# Patient Record
Sex: Male | Born: 1995 | Race: Black or African American | Hispanic: No | Marital: Single | Smoking: Current every day smoker
Health system: Southern US, Community
[De-identification: ages and names within clinical notes are randomized; demographics above are authoritative.]

## PROBLEM LIST (undated history)

## (undated) DIAGNOSIS — F419 Anxiety disorder, unspecified: Secondary | ICD-10-CM

## (undated) DIAGNOSIS — F32A Depression, unspecified: Secondary | ICD-10-CM

## (undated) DIAGNOSIS — F329 Major depressive disorder, single episode, unspecified: Secondary | ICD-10-CM

---
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
## 2018-06-09 LAB — HM HIV SCREENING LAB: HM HIV SCREENING: NEGATIVE

## 2018-11-05 ENCOUNTER — Other Ambulatory Visit: Payer: Self-pay

## 2018-11-05 ENCOUNTER — Emergency Department (HOSPITAL_COMMUNITY)
Admission: EM | Admit: 2018-11-05 | Discharge: 2018-11-05 | Disposition: A | Discharge status: Home / Self Care | Payer: Medicaid Other | Attending: Emergency Medicine | Admitting: Emergency Medicine

## 2018-11-05 ENCOUNTER — Encounter (HOSPITAL_COMMUNITY): Payer: Self-pay | Admitting: Emergency Medicine

## 2018-11-05 DIAGNOSIS — R300 Dysuria: Secondary | ICD-10-CM | POA: Insufficient documentation

## 2018-11-05 DIAGNOSIS — R21 Rash and other nonspecific skin eruption: Secondary | ICD-10-CM | POA: Diagnosis present

## 2018-11-05 DIAGNOSIS — Z202 Contact with and (suspected) exposure to infections with a predominantly sexual mode of transmission: Secondary | ICD-10-CM | POA: Diagnosis not present

## 2018-11-05 DIAGNOSIS — B356 Tinea cruris: Secondary | ICD-10-CM | POA: Insufficient documentation

## 2018-11-05 LAB — URINALYSIS, ROUTINE W REFLEX MICROSCOPIC
BILIRUBIN URINE: NEGATIVE
Glucose, UA: NEGATIVE mg/dL
Hgb urine dipstick: NEGATIVE
Ketones, ur: NEGATIVE mg/dL
Nitrite: NEGATIVE
Protein, ur: NEGATIVE mg/dL
SPECIFIC GRAVITY, URINE: 1.025 (ref 1.005–1.030)
pH: 7 (ref 5.0–8.0)

## 2018-11-05 MED ORDER — TERBINAFINE HCL 1 % EX CREA: 1.0000 "application " | TOPICAL_CREAM | Freq: Two times a day (BID) | CUTANEOUS | 0 refills | Status: DC

## 2018-11-05 MED ORDER — ONDANSETRON 4 MG PO TBDP
8.0000 mg | ORAL_TABLET | Freq: Once | ORAL | Status: AC
  Administered 2018-11-05: 8 mg via ORAL
  Filled 2018-11-05: qty 2

## 2018-11-05 MED ORDER — AZITHROMYCIN 250 MG PO TABS
1000.0000 mg | ORAL_TABLET | Freq: Once | ORAL | Status: AC
  Administered 2018-11-05: 1000 mg via ORAL
  Filled 2018-11-05: qty 4

## 2018-11-05 MED ORDER — CEFTRIAXONE SODIUM 250 MG IJ SOLR
250.0000 mg | Freq: Once | INTRAMUSCULAR | Status: AC
  Administered 2018-11-05: 250 mg via INTRAMUSCULAR
  Filled 2018-11-05: qty 250

## 2018-11-05 MED ORDER — LIDOCAINE HCL (PF) 1 % IJ SOLN
INTRAMUSCULAR | Status: AC
  Administered 2018-11-05: 0.9 mL
  Filled 2018-11-05: qty 5

## 2018-11-05 MED ORDER — METRONIDAZOLE 500 MG PO TABS
2000.0000 mg | ORAL_TABLET | Freq: Once | ORAL | Status: AC
  Administered 2018-11-05: 2000 mg via ORAL
  Filled 2018-11-05: qty 4

## 2018-11-05 NOTE — Discharge Instructions (Signed)
You have been treated for gonorrhea, chlamydia, and trichomonas.  Please take the terbinafine cream applied to the scrotum twice a day for 7 days.  This is for jock itch.  Use a condom with every sexual encounter Please follow-up with the health department as needed for future STI needs. Please return to the ER for worsening symptoms, high fevers or persistent vomiting.  You have been tested for HIV, syphilis, chlamydia and gonorrhea. These results will be available in approximately 3 days. You will be notified if they are positive.   It is very important to practice safe sex and use condoms when sexually active. If your results are positive you need to notify all sexual partners so they can be treated as well. The website https://garcia.net/ can be used to send anonymous text messages or emails to alert sexual contacts.   SEEK IMMEDIATE MEDICAL CARE IF:  You develop an oral temperature above 102 F (38.9 C), not controlled by medications or lasting more than 2 days.  You develop an increase in pain.  Please return the emergency department you develop any testicular pain, pain with bowel movements, vomiting, nausea, or vomiting.

## 2018-11-05 NOTE — ED Provider Notes (Signed)
[name] Provider Note   CSN: [id number] Arrival date & time: 11/05/18  1229    History   Chief Complaint Chief Complaint  Patient presents with  . SEXUALLY TRANSMITTED DISEASE    HPI [name] is a 23 y.o. male.     HPI  Patient is a 23 year old male with no significant past medical history presenting for STI exposure.  Patient reports he began having dysuria today.  Patient reports that a partner that he has had sexual intercourse with within the past month tested positive for chlamydia.  He is not aware of any other STIs, however he has had other partners.  Patient reports that he has noted a rash on his scrotum that is slightly painful.  It preceded his symptoms of dysuria.  Patient denies any abdominal pain, nausea, vomiting, fevers, rectal pain or pain with bowel movements.  History reviewed. No pertinent past medical history.  There are no active problems to display for this patient.   History reviewed. No pertinent surgical history.      Home Medications    Prior to Admission medications   Not on File    Family History History reviewed. No pertinent family history.  Social History Social History   Tobacco Use  . Smoking status: Not on file  Substance Use Topics  . Alcohol use: Not on file  . Drug use: Yes    Types: Marijuana     Allergies   Patient has no known allergies.   Review of Systems Review of Systems  Constitutional: Negative for chills and fever.  Gastrointestinal: Negative for abdominal pain, nausea and vomiting.  Genitourinary: Positive for dysuria. Negative for flank pain, frequency, penile pain, penile swelling, testicular pain and urgency.     Physical Exam Updated Vital Signs BP 132/76 (BP Location: Right Arm)   Pulse 69   Temp 97.8 F (36.6 C) (Oral)   Resp 18   SpO2 97%   Physical Exam Vitals signs and nursing note reviewed.  Constitutional:      General: He is  not in acute distress.    Appearance: He is well-developed. He is not diaphoretic.     Comments: Sitting comfortably in bed.  HENT:     Head: Normocephalic and atraumatic.  Eyes:     General:        Right eye: No discharge.        Left eye: No discharge.     Conjunctiva/sclera: Conjunctivae normal.     Comments: EOMs normal to gross examination.  Neck:     Musculoskeletal: Normal range of motion.  Cardiovascular:     Rate and Rhythm: Normal rate and regular rhythm.     Comments: Intact, 2+ radial pulse. Abdominal:     General: There is no distension.     Tenderness: There is no abdominal tenderness.  Genitourinary:    Comments: Physical examination performed with RN chaperone present.  Patient has no discharge from penis.  No visible lesions of the scrotum.  Patient is reporting some irritation at junction of perineum.  Musculoskeletal: Normal range of motion.  Skin:    General: Skin is warm and dry.  Neurological:     Mental Status: He is alert.     Comments: Cranial nerves intact to gross observation. Patient moves extremities without difficulty.  Psychiatric:        Behavior: Behavior normal.        Thought Content: Thought content normal.  Judgment: Judgment normal.      ED Treatments / Results  Labs (all labs ordered are listed, but only abnormal results are displayed) Labs Reviewed  URINALYSIS, ROUTINE W REFLEX MICROSCOPIC - Abnormal; Notable for the following components:      Result Value   Leukocytes,Ua SMALL (*)    Bacteria, UA RARE (*)    All other components within normal limits  RPR  HIV ANTIBODY (ROUTINE TESTING W REFLEX)  GC/CHLAMYDIA PROBE AMP ([place]) NOT AT [facility]    EKG None  Radiology No results found.  Procedures Procedures (including critical care time)  Medications Ordered in ED Medications  cefTRIAXone (ROCEPHIN) injection 250 mg (has no administration in time range)  azithromycin (ZITHROMAX) tablet 1,000 mg (has no  administration in time range)  metroNIDAZOLE (FLAGYL) tablet 2,000 mg (has no administration in time range)  ondansetron (ZOFRAN-ODT) disintegrating tablet 8 mg (has no administration in time range)     Initial Impression / Assessment and Plan / ED Course  I have reviewed the triage vital signs and the nursing notes.  Pertinent labs & imaging results that were available during my care of the patient were reviewed by me and considered in my medical decision making (see chart for details).  Clinical Course as of Nov 05 1349  Fri Nov 05, 2018  1337 Leukocytes likely 2/2 to STI.   Urinalysis, Routine w reflex microscopic(!) [AM]    Clinical Course User Index [AM] Phillip Ponder, PA-C       Patient is nontoxic appearing, afebrile, and in no acute distress.  Patient with known exposure to chlamydia.  Patient has had multiple sexual encounters, unprotected in the last 3 months.  Patient was treated for gonorrhea, chlamydia, trichomonas.  Will treat empirically for tinea cruris.  He is instructed that he will receive any results in 48 to 72 hours, would not receive a call for any negative results.  He was instructed to contact all partners to notify for testing and treatment.  Return precautions given for any increasing pain, scrotal pain or swelling, rectal pain, abdominal pain, nausea or vomiting.  Patient is understanding and agrees with the plan of care.  Final Clinical Impressions(s) / ED Diagnoses   Final diagnoses:  STD exposure  Tinea cruris    ED Discharge Orders         Ordered    terbinafine (LAMISIL) 1 % cream  2 times daily     11/05/18 1349           [name], [place] 11/05/18 1352    Tegeler, Phillip Brim, MD 11/05/18 [phone]

## 2018-11-05 NOTE — ED Notes (Signed)
Pt stable, ambulatory, and verbalizes understanding of d/c instructions. Armband removed, pt discharged from ED. 

## 2018-11-05 NOTE — ED Triage Notes (Signed)
Pt presents for eval of STDs, pt states his girlfriend was diagnosed with chlamydia, states he has the same. States it feels like "diamonds are coming out" when voiding. Pt states he has been unable to get in with the clinic for STD testing.

## 2018-11-06 LAB — RPR: RPR Ser Ql: NONREACTIVE

## 2018-11-06 LAB — HIV ANTIBODY (ROUTINE TESTING W REFLEX): HIV Screen 4th Generation wRfx: NONREACTIVE

## 2018-11-08 LAB — GC/CHLAMYDIA PROBE AMP (~~LOC~~) NOT AT ARMC
Chlamydia: POSITIVE — AB
Neisseria Gonorrhea: NEGATIVE

## 2019-09-01 ENCOUNTER — Other Ambulatory Visit: Payer: Self-pay

## 2019-09-01 ENCOUNTER — Encounter (HOSPITAL_COMMUNITY): Payer: Self-pay

## 2019-09-01 ENCOUNTER — Emergency Department (HOSPITAL_COMMUNITY)
Admission: EM | Admit: 2019-09-01 | Discharge: 2019-09-02 | Disposition: A | Discharge status: Home / Self Care | Payer: Medicaid Other | Attending: Emergency Medicine | Admitting: Emergency Medicine

## 2019-09-01 DIAGNOSIS — Y999 Unspecified external cause status: Secondary | ICD-10-CM | POA: Diagnosis not present

## 2019-09-01 DIAGNOSIS — S50819A Abrasion of unspecified forearm, initial encounter: Secondary | ICD-10-CM | POA: Diagnosis present

## 2019-09-01 DIAGNOSIS — Z20828 Contact with and (suspected) exposure to other viral communicable diseases: Secondary | ICD-10-CM | POA: Diagnosis not present

## 2019-09-01 DIAGNOSIS — Y92009 Unspecified place in unspecified non-institutional (private) residence as the place of occurrence of the external cause: Secondary | ICD-10-CM | POA: Insufficient documentation

## 2019-09-01 DIAGNOSIS — F1210 Cannabis abuse, uncomplicated: Secondary | ICD-10-CM | POA: Diagnosis not present

## 2019-09-01 DIAGNOSIS — Z23 Encounter for immunization: Secondary | ICD-10-CM | POA: Insufficient documentation

## 2019-09-01 DIAGNOSIS — F314 Bipolar disorder, current episode depressed, severe, without psychotic features: Secondary | ICD-10-CM | POA: Diagnosis not present

## 2019-09-01 DIAGNOSIS — Y939 Activity, unspecified: Secondary | ICD-10-CM | POA: Insufficient documentation

## 2019-09-01 DIAGNOSIS — R45851 Suicidal ideations: Secondary | ICD-10-CM | POA: Insufficient documentation

## 2019-09-01 DIAGNOSIS — X789XXA Intentional self-harm by unspecified sharp object, initial encounter: Secondary | ICD-10-CM | POA: Insufficient documentation

## 2019-09-01 LAB — CBC WITH DIFFERENTIAL/PLATELET
Abs Immature Granulocytes: 0.03 10*3/uL (ref 0.00–0.07)
Basophils Absolute: 0 10*3/uL (ref 0.0–0.1)
Basophils Relative: 0 %
Eosinophils Absolute: 0 10*3/uL (ref 0.0–0.5)
Eosinophils Relative: 0 %
HCT: 44.3 % (ref 39.0–52.0)
Hemoglobin: 14 g/dL (ref 13.0–17.0)
Immature Granulocytes: 0 %
Lymphocytes Relative: 26 %
Lymphs Abs: 2.6 10*3/uL (ref 0.7–4.0)
MCH: 25 pg — ABNORMAL LOW (ref 26.0–34.0)
MCHC: 31.6 g/dL (ref 30.0–36.0)
MCV: 79.1 fL — ABNORMAL LOW (ref 80.0–100.0)
Monocytes Absolute: 0.6 10*3/uL (ref 0.1–1.0)
Monocytes Relative: 6 %
Neutro Abs: 6.6 10*3/uL (ref 1.7–7.7)
Neutrophils Relative %: 68 %
Platelets: 223 10*3/uL (ref 150–400)
RBC: 5.6 MIL/uL (ref 4.22–5.81)
RDW: 16.5 % — ABNORMAL HIGH (ref 11.5–15.5)
WBC: 9.9 10*3/uL (ref 4.0–10.5)
nRBC: 0 % (ref 0.0–0.2)

## 2019-09-01 LAB — COMPREHENSIVE METABOLIC PANEL
ALT: 16 U/L (ref 0–44)
AST: 29 U/L (ref 15–41)
Albumin: 4.5 g/dL (ref 3.5–5.0)
Alkaline Phosphatase: 44 U/L (ref 38–126)
Anion gap: 11 (ref 5–15)
BUN: 8 mg/dL (ref 6–20)
CO2: 24 mmol/L (ref 22–32)
Calcium: 9.7 mg/dL (ref 8.9–10.3)
Chloride: 107 mmol/L (ref 98–111)
Creatinine, Ser: 1.28 mg/dL — ABNORMAL HIGH (ref 0.61–1.24)
GFR calc Af Amer: >60 mL/min (ref >60)
GFR calc non Af Amer: >60 mL/min (ref >60)
Glucose, Bld: 91 mg/dL (ref 70–99)
Potassium: 3.5 mmol/L (ref 3.5–5.1)
Sodium: 142 mmol/L (ref 135–145)
Total Bilirubin: 0.9 mg/dL (ref 0.3–1.2)
Total Protein: 7.1 g/dL (ref 6.5–8.1)

## 2019-09-01 LAB — ETHANOL: Alcohol, Ethyl (B): <10 mg/dL (ref <10)

## 2019-09-01 LAB — RAPID URINE DRUG SCREEN, HOSP PERFORMED
Amphetamines: NOT DETECTED
Barbiturates: NOT DETECTED
Benzodiazepines: NOT DETECTED
Cocaine: NOT DETECTED
Opiates: NOT DETECTED
Tetrahydrocannabinol: POSITIVE — AB

## 2019-09-01 LAB — RESPIRATORY PANEL BY RT PCR (FLU A&B, COVID)
Influenza A by PCR: NEGATIVE
Influenza B by PCR: NEGATIVE
SARS Coronavirus 2 by RT PCR: NEGATIVE

## 2019-09-01 LAB — ACETAMINOPHEN LEVEL: Acetaminophen (Tylenol), Serum: <10 ug/mL — ABNORMAL LOW (ref 10–30)

## 2019-09-01 LAB — SALICYLATE LEVEL: Salicylate Lvl: <7 mg/dL — ABNORMAL LOW (ref 7.0–30.0)

## 2019-09-01 MED ORDER — ZIPRASIDONE MESYLATE 20 MG IM SOLR
20.0000 mg | INTRAMUSCULAR | Status: DC | PRN
  Administered 2019-09-01: 20 mg via INTRAMUSCULAR
  Filled 2019-09-01: qty 20

## 2019-09-01 MED ORDER — LORAZEPAM 1 MG PO TABS
1.0000 mg | ORAL_TABLET | Freq: Once | ORAL | Status: AC
  Administered 2019-09-01: 1 mg via ORAL
  Filled 2019-09-01: qty 1

## 2019-09-01 MED ORDER — TETANUS-DIPHTH-ACELL PERTUSSIS 5-2.5-18.5 LF-MCG/0.5 IM SUSP
0.5000 mL | Freq: Once | INTRAMUSCULAR | Status: AC
  Administered 2019-09-01: 0.5 mL via INTRAMUSCULAR
  Filled 2019-09-01: qty 0.5

## 2019-09-01 MED ORDER — DIPHENHYDRAMINE HCL 50 MG/ML IJ SOLN
50.0000 mg | Freq: Once | INTRAMUSCULAR | Status: AC
  Administered 2019-09-01: 50 mg via INTRAMUSCULAR
  Filled 2019-09-01: qty 1

## 2019-09-01 MED ORDER — ZIPRASIDONE MESYLATE 20 MG IM SOLR
10.0000 mg | INTRAMUSCULAR | Status: DC | PRN
  Filled 2019-09-01: qty 20
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
## 2019-09-01 NOTE — ED Notes (Signed)
TTS bedside 

## 2019-09-01 NOTE — ED Notes (Signed)
ED Provider at bedside. 
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
## 2019-09-02 ENCOUNTER — Inpatient Hospital Stay (HOSPITAL_COMMUNITY)
Admission: AD | Admit: 2019-09-02 | Discharge: 2019-09-05 | DRG: 885 | Disposition: A | Discharge status: Home / Self Care | Payer: Medicaid Other | Attending: Psychiatry | Admitting: Psychiatry

## 2019-09-02 ENCOUNTER — Encounter (HOSPITAL_COMMUNITY): Payer: Self-pay | Admitting: Psychiatry

## 2019-09-02 ENCOUNTER — Other Ambulatory Visit: Payer: Self-pay

## 2019-09-02 DIAGNOSIS — F1290 Cannabis use, unspecified, uncomplicated: Secondary | ICD-10-CM | POA: Diagnosis present

## 2019-09-02 DIAGNOSIS — F17210 Nicotine dependence, cigarettes, uncomplicated: Secondary | ICD-10-CM | POA: Diagnosis present

## 2019-09-02 DIAGNOSIS — Z7289 Other problems related to lifestyle: Secondary | ICD-10-CM | POA: Diagnosis not present

## 2019-09-02 DIAGNOSIS — Z818 Family history of other mental and behavioral disorders: Secondary | ICD-10-CM | POA: Diagnosis not present

## 2019-09-02 DIAGNOSIS — F4321 Adjustment disorder with depressed mood: Secondary | ICD-10-CM | POA: Diagnosis not present

## 2019-09-02 DIAGNOSIS — F319 Bipolar disorder, unspecified: Principal | ICD-10-CM | POA: Diagnosis present

## 2019-09-02 DIAGNOSIS — S51811D Laceration without foreign body of right forearm, subsequent encounter: Secondary | ICD-10-CM | POA: Diagnosis not present

## 2019-09-02 DIAGNOSIS — X781XXD Intentional self-harm by knife, subsequent encounter: Secondary | ICD-10-CM | POA: Diagnosis present

## 2019-09-02 DIAGNOSIS — T1491XA Suicide attempt, initial encounter: Secondary | ICD-10-CM | POA: Diagnosis not present

## 2019-09-02 DIAGNOSIS — Z560 Unemployment, unspecified: Secondary | ICD-10-CM

## 2019-09-02 DIAGNOSIS — S51812D Laceration without foreign body of left forearm, subsequent encounter: Secondary | ICD-10-CM

## 2019-09-02 DIAGNOSIS — Z590 Homelessness: Secondary | ICD-10-CM

## 2019-09-02 DIAGNOSIS — IMO0002 Reserved for concepts with insufficient information to code with codable children: Secondary | ICD-10-CM

## 2019-09-02 DIAGNOSIS — S50819A Abrasion of unspecified forearm, initial encounter: Secondary | ICD-10-CM | POA: Diagnosis not present

## 2019-09-02 HISTORY — DX: Anxiety disorder, unspecified: F41.9

## 2019-09-02 HISTORY — DX: Depression, unspecified: F32.A

## 2019-09-02 MED ORDER — STERILE WATER FOR INJECTION IJ SOLN
INTRAMUSCULAR | Status: AC
  Filled 2019-09-02: qty 10

## 2019-09-02 MED ORDER — HYDROXYZINE HCL 25 MG PO TABS: 25.0000 mg | ORAL_TABLET | Freq: Three times a day (TID) | ORAL | Status: DC | PRN

## 2019-09-02 MED ORDER — ALUM & MAG HYDROXIDE-SIMETH 200-200-20 MG/5ML PO SUSP: 30.0000 mL | ORAL | Status: DC | PRN

## 2019-09-02 MED ORDER — MAGNESIUM HYDROXIDE 400 MG/5ML PO SUSP: 30.0000 mL | Freq: Every day | ORAL | Status: DC | PRN

## 2019-09-02 MED ORDER — ACETAMINOPHEN 325 MG PO TABS
650.0000 mg | ORAL_TABLET | Freq: Four times a day (QID) | ORAL | Status: DC | PRN
  Administered 2019-09-05: 650 mg via ORAL
  Filled 2019-09-02: qty 2

## 2019-09-02 MED ORDER — LORAZEPAM 1 MG PO TABS
1.0000 mg | ORAL_TABLET | Freq: Once | ORAL | Status: AC | PRN
  Administered 2019-09-02: 1 mg via ORAL
  Filled 2019-09-02: qty 1

## 2019-09-02 MED ORDER — TRAZODONE HCL 50 MG PO TABS: 50.0000 mg | ORAL_TABLET | Freq: Every evening | ORAL | Status: DC | PRN

## 2019-09-02 MED ORDER — NICOTINE 21 MG/24HR TD PT24
21.0000 mg | MEDICATED_PATCH | Freq: Every day | TRANSDERMAL | Status: DC
  Filled 2019-09-02 (×6): qty 1
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
## 2019-09-02 NOTE — ED Provider Notes (Signed)
Emergency Medicine Observation Re-evaluation Note  [name] is a 23 y.o. male, seen on rounds today.  Pt initially presented to the ED for complaints of IVC and Suicidal Currently, the patient is sitting in bed watching TV. He is unhappy about being in the hospital on Christmas, but was able to be calmed and redirected by nursing staff. No events overnight. VSS.  Physical Exam  BP (!) (P) 105/56 (BP Location: Left Arm)   Pulse (!) (P) 57   Temp (P) 97.6 F (36.4 C) (Oral)   Resp (P) 18   SpO2 (P) 97%  Physical Exam PE: Constitutional: well-developed, well-nourished, no apparent distress HENT: normocephalic, atraumatic.  Cardiovascular: normal rate and rhythm, Pulmonary/Chest: effort normal;  Abdominal: nondistended Musculoskeletal: full ROM, no edema Neurological: alert  Skin: warm and dry, no rash, no diaphoresis Psychiatric: normal mood   ED Course / MDM  EKG:    I have reviewed the labs performed to date as well as medications administered while in observation.  Recent changes in the last 24 hours include none. Plan  Current plan is for inpatient bed at [facility]. Patient likely to be transferred later today. Patient is under full IVC at this time.   Flint Melter 09/02/19 [id number]    Dorie Rank, MD 09/03/19 [phone]
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
## 2019-09-02 NOTE — B[facility] Group Notes (Signed)
Adult Psychoeducational Group Note  Date:  09/02/2019 Time:  9:07 PM  Group Topic/Focus:  Wrap-Up Group:   The focus of this group is to help patients review their daily goal of treatment and discuss progress on daily workbooks.  Participation Level:  Active  Participation Quality:  Appropriate and Attentive  Affect:  Appropriate  Cognitive:  Alert and Appropriate  Insight: Appropriate and Good  Engagement in Group:  Engaged  Modes of Intervention:  Discussion and Education  Additional Comments:  Pt attended and participated in wrap up group this evening. Pt was appropriate in group.   Cristi Loron 09/02/2019, 9:07 PM
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
## 2019-09-02 NOTE — Plan of Care (Signed)
Nurse discussed anxiety, depression and coping skills with patient.  

## 2019-09-02 NOTE — BH Assessment (Signed)
[place] Assessment Progress Note   Patient has been accepted to [facility] for admission after 3 pm.  Johnn Hai will be the attending MD.  Report will need to be called at discharge to [phone].

## 2019-09-02 NOTE — ED Notes (Signed)
Called [facility] for 3 pm transfer.

## 2019-09-03 DIAGNOSIS — F4321 Adjustment disorder with depressed mood: Secondary | ICD-10-CM

## 2019-09-03 DIAGNOSIS — T1491XA Suicide attempt, initial encounter: Secondary | ICD-10-CM

## 2019-09-03 MED ORDER — ARIPIPRAZOLE 5 MG PO TABS
5.0000 mg | ORAL_TABLET | Freq: Every day | ORAL | Status: DC
  Administered 2019-09-03 – 2019-09-05 (×3): 5 mg via ORAL
  Filled 2019-09-03 (×6): qty 1

## 2019-09-03 MED ORDER — LOPERAMIDE HCL 2 MG PO CAPS: 2.0000 mg | ORAL_CAPSULE | ORAL | Status: DC | PRN

## 2019-09-03 MED ORDER — THIAMINE HCL 100 MG PO TABS
100.0000 mg | ORAL_TABLET | Freq: Every day | ORAL | Status: DC
  Administered 2019-09-04 – 2019-09-05 (×2): 100 mg via ORAL
  Filled 2019-09-03 (×4): qty 1

## 2019-09-03 MED ORDER — CITALOPRAM HYDROBROMIDE 10 MG PO TABS
10.0000 mg | ORAL_TABLET | Freq: Every day | ORAL | Status: DC
  Administered 2019-09-03 – 2019-09-05 (×3): 10 mg via ORAL
  Filled 2019-09-03 (×6): qty 1

## 2019-09-03 MED ORDER — ONDANSETRON 4 MG PO TBDP: 4.0000 mg | ORAL_TABLET | Freq: Four times a day (QID) | ORAL | Status: DC | PRN

## 2019-09-03 MED ORDER — ADULT MULTIVITAMIN W/MINERALS CH
1.0000 | ORAL_TABLET | Freq: Every day | ORAL | Status: DC
  Administered 2019-09-04 – 2019-09-05 (×2): 1 via ORAL
  Filled 2019-09-03 (×5): qty 1

## 2019-09-03 MED ORDER — LORAZEPAM 1 MG PO TABS: 1.0000 mg | ORAL_TABLET | Freq: Four times a day (QID) | ORAL | Status: DC | PRN

## 2019-09-03 MED ORDER — LORAZEPAM 1 MG PO TABS: 1.0000 mg | ORAL_TABLET | ORAL | Status: DC | PRN

## 2019-09-03 MED ORDER — RISPERIDONE 2 MG PO TBDP: 2.0000 mg | ORAL_TABLET | Freq: Three times a day (TID) | ORAL | Status: DC | PRN

## 2019-09-03 MED ORDER — HYDROXYZINE HCL 25 MG PO TABS: 25.0000 mg | ORAL_TABLET | Freq: Four times a day (QID) | ORAL | Status: DC | PRN

## 2019-09-03 MED ORDER — ZIPRASIDONE MESYLATE 20 MG IM SOLR: 20.0000 mg | INTRAMUSCULAR | Status: DC | PRN

## 2019-09-03 NOTE — Progress Notes (Signed)
EKG results placed on the outside of shadow chart   Normal sinus rhythm Normal ECG QT/QTc 382/406 ms

## 2019-09-03 NOTE — Progress Notes (Signed)
D. Pt presents as friendly, hyper-verbal, calm and cooperative- visible in the milieu interacting well with peers. Per pt's self inventory, pt rated his depression, hopelessness and anxiety all 0's. Pt writes that his goal today is "honestly, I'm just ready to talk to sumbody (sp) so I can go home."  I really miss my kids". Pt writes that he will "just be happy and keep my mind off things" to help him achieve his goal. Pt also reports, "I would love to play basketball it really helps me think".Pt currently denies SI/HI and AVH A. Labs and vitals monitored. Pt compliant with meds. Pt supported emotionally and encouraged to express concerns and ask questions.   R. Pt remains safe with 15 minute checks. Will continue POC.
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
## 2019-09-04 LAB — HEMOGLOBIN A1C
Hgb A1c MFr Bld: 5.5 % (ref 4.8–5.6)
Mean Plasma Glucose: 111.15 mg/dL

## 2019-09-04 LAB — LIPID PANEL
Cholesterol: 129 mg/dL (ref 0–200)
HDL: 35 mg/dL — ABNORMAL LOW (ref >40)
LDL Cholesterol: 83 mg/dL (ref 0–99)
Total CHOL/HDL Ratio: 3.7 RATIO
Triglycerides: 56 mg/dL (ref <150)
VLDL: 11 mg/dL (ref 0–40)

## 2019-09-04 NOTE — [facility] Group Notes (Signed)
[facility] LCSW Group Therapy Note  09/04/2019  10:00-11:00AM  Type of Therapy and Topic:  Group Therapy:  A Hero Worthy of Support  Participation Level:  Active   Description of Group:  Patients in this group were introduced to the concept that additional supports including self-support are an essential part of recovery.  Matching needs with supports to help fulfill those needs was explained.  A song "I Got To Live" was played for the group and was followed by a discussion of what it meant to participants.   The consensus was that the message was to give themselves permission to see happiness in life.  A song entitled "My Own Hero" was played and a group discussion ensued in which patients stated it inspired them to help themselves in order to succeed, because other people cannot achieve their goals such as sobriety or stability for them.  A song was played called "I Am Enough" which led to a discussion about being willing to believe we are worth the effort of being a self-support.   Therapeutic Goals: 1)  demonstrate the importance of being a key part of one's own support system 2)  discuss various available supports 3)  encourage patient to use music as part of their self-support and focus on goals 4)  elicit ideas from patients about supports that need to be added   Summary of Patient Progress:  The patient expressed that his grandma is a healthy support for him because she is consistently available to him to talk to, and she encourages him to let out his feelings even when it means he has to cuss to do it.  There are ways in which his "baby mama" can be helpful like picking up the phone to talk to him even in the middle of the night.  However, his mother and a different baby mama are not healthy for him.  The patient monopolized much of group again but could be redirected.  Therapeutic Modalities:   Motivational Interviewing Activity  Maretta Los

## 2019-09-04 NOTE — [facility] Group Notes (Signed)
Adult Psychoeducational Group Note  Date:  09/04/2019 Time:  9:04 PM  Group Topic/Focus:  Wrap-Up Group:   The focus of this group is to help patients review their daily goal of treatment and discuss progress on daily workbooks.  Participation Level:  Active  Participation Quality:  Appropriate  Affect:  Appropriate  Cognitive:  Appropriate  Insight: Appropriate  Engagement in Group:  Engaged  Modes of Intervention:  Discussion  Additional Comments:  Pt stated his goal was to discuss discharge with the doctor.  Pt stated he did meet his goal.  Pt was sad to learn his grandmother passed away today.  Pt rated the day at a 5/10.  Jakirah Zaun 09/04/2019, 9:04 PM

## 2019-09-04 NOTE — Progress Notes (Signed)
Patient ID: [name], male   DOB: 03-03-1996, 23 y.o.   MRN: [id number] D: Pt with elated mood, cooperative, and interactive with his peers in the day room earlier in shift. Pt denies SI/HI/AVH, and denies any other concerns.  A: Pt is being maintained on Q15 minute checks for safety. No meds scheduled for shift, pt refused offer of medications for insomnia.  R: Pt currently in bed resting with no signs/symptoms of distress, will continue to monitor for safety on Q15 minute checks
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
## 2019-09-04 NOTE — Progress Notes (Signed)
D. Pt is friendly upon approach- calm and cooperative- is visible in the milieu interacting well with peers. Pt currently denies SI/HI and AVH A. Labs and vitals monitored. Pt compliant with medications. Pt supported emotionally and encouraged to express concerns and ask questions.   R. Pt remains safe with 15 minute checks. Will continue POC.

## 2019-09-04 NOTE — Progress Notes (Signed)
[facility] Group Notes:  (Nursing/MHT/Case Management/Adjunct)  Date:  09/04/2019  Time:  1300  Type of Therapy:  Nurse Education-Discussed healthy support systems and created collages.   Participation Level:  Active  Participation Quality:  Appropriate  Affect:  Appropriate  Cognitive:  Appropriate  Insight:  Appropriate  Engagement in Group:  Engaged  Modes of Intervention:  Activity and Discussion  Summary of Progress/Problems:   
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
## 2019-09-04 NOTE — Progress Notes (Signed)
[place] NOVEL CORONAVIRUS (COVID-19) DAILY CHECK-OFF SYMPTOMS - answer yes or no to each - every day NO YES  Have you had a fever in the past 24 hours?  . Fever (Temp > 37.80C / 100F) X   Have you had any of these symptoms in the past 24 hours? . New Cough .  Sore Throat  .  Shortness of Breath .  Difficulty Breathing .  Unexplained Body Aches   X   Have you had any one of these symptoms in the past 24 hours not related to allergies?   . Runny Nose .  Nasal Congestion .  Sneezing   X   If you have had runny nose, nasal congestion, sneezing in the past 24 hours, has it worsened?  X   EXPOSURES - check yes or no X   Have you traveled outside the state in the past 14 days?  X   Have you been in contact with someone with a confirmed diagnosis of COVID-19 or PUI in the past 14 days without wearing appropriate PPE?  X   Have you been living in the same home as a person with confirmed diagnosis of COVID-19 or a PUI (household contact)?    X   Have you been diagnosed with COVID-19?    X              What to do next: Answered NO to all: Answered YES to anything:   Proceed with unit schedule Follow the [facility] Inpatient Flowsheet.   

## 2019-09-04 NOTE — Progress Notes (Signed)
Patient ID: [name], male   DOB: 05/23/1996, 23 y.o.   MRN: [id number] DAR Note: Pt observed in the day room at start of shift interacting with his peers, denied SI/HI/AVH.  Pt did not have any scheduled meds, but was offered a sleep aide, and declined.  Pt denied any concerns and is currently resting in bed with no signs/symptoms of distress.  Q15 minute checks being maintained for safety.

## 2019-09-05 DIAGNOSIS — IMO0002 Reserved for concepts with insufficient information to code with codable children: Secondary | ICD-10-CM

## 2019-09-05 DIAGNOSIS — Z7289 Other problems related to lifestyle: Secondary | ICD-10-CM

## 2019-09-05 MED ORDER — TRAZODONE HCL 50 MG PO TABS: 50.0000 mg | ORAL_TABLET | Freq: Every evening | ORAL | 0 refills | Status: DC | PRN

## 2019-09-05 MED ORDER — ARIPIPRAZOLE 5 MG PO TABS: 5.0000 mg | ORAL_TABLET | Freq: Every day | ORAL | 0 refills | Status: DC

## 2019-09-05 MED ORDER — NICOTINE 21 MG/24HR TD PT24: 21.0000 mg | MEDICATED_PATCH | Freq: Every day | TRANSDERMAL | 0 refills | Status: DC

## 2019-09-05 MED ORDER — CITALOPRAM HYDROBROMIDE 10 MG PO TABS: 10.0000 mg | ORAL_TABLET | Freq: Every day | ORAL | 0 refills | Status: DC

## 2019-09-05 NOTE — Progress Notes (Signed)
D:  Patient denied SI and HI, contracts for safety.  Denied A/V hallucinations.  Denied pain. A:  Medications administered per MD orders.  Emotional support and encouragement given patient. R:  Safety maintained with 15 minute checks.  

## 2019-09-05 NOTE — Progress Notes (Signed)
  [facility] Adult Case Management Discharge Plan :  Will you be returning to the same living situation after discharge:  Yes,  patient reports he is returning home At discharge, do you have transportation home?: Yes,  patient's grandmother is picking him up Do you have the ability to pay for your medications: Yes,  Medicaid   Release of information consent forms completed and in the chart;  Patient's signature needed at discharge.  Patient to Follow up at: Follow-up Information    [facility] Follow up on 09/13/2019.   Why: Your hospital discharge appointment is scheduled for 01/05 at 1:30pm. Your appointment will be held by phone and the provider will call you. Please have access to your hospital discharge paperwork.  Contact information: [address] [place] [address] [phone]           Next level of care provider has access to [place] and Suicide Prevention discussed: Yes,  with the patient's grandmother     Has patient been referred to the Quitline?: N/A patient is not a smoker  Patient has been referred for addiction treatment: N/A  Marylee Floras, [place] 09/05/2019, 10:59 AM

## 2019-09-05 NOTE — [facility] Suicide Risk Assessment (Signed)
[place] INPATIENT:  Family/Significant Other Suicide Prevention Education  Suicide Prevention Education:  Education Completed; grandmother, Meriam Sprague [phone] has been identified by the patient as the family member/significant other with whom the patient will be residing, and identified as the person(s) who will aid the patient in the event of a mental health crisis (suicidal ideations/suicide attempt).  With written consent from the patient, the family member/significant other has been provided the following suicide prevention education, prior to the and/or following the discharge of the patient.  The suicide prevention education provided includes the following:  Suicide risk factors  Suicide prevention and interventions  National Suicide Hotline telephone number  [facility] assessment telephone number  [facility] Emergency Assistance [place] and/or Residential Mobile Crisis Unit telephone number  Request made of family/significant other to:  Remove weapons (e.g., guns, rifles, knives), all items previously/currently identified as safety concern.    Remove drugs/medications (over-the-counter, prescriptions, illicit drugs), all items previously/currently identified as a safety concern.  The family member/significant other verbalizes understanding of the suicide prevention education information provided.  The family member/significant other agrees to remove the items of safety concern listed above.  No questions or concerns. Grandmother knows patient is discharging and will pick up the patient at 12:00pm.  Joellen Jersey 09/05/2019, 10:15 AM

## 2019-09-05 NOTE — Progress Notes (Signed)
Recreation Therapy Notes  Date:  12.2820 Time: 0930 Location: 300 Hall Dayroom  Group Topic: Stress Management  Goal Area(s) Addresses:  Patient will identify positive stress management techniques. Patient will identify benefits of using stress management post d/c.  Behavioral Response:  Engaged  Intervention:  Stress Management  Activity :  Meditation.  LRT played a meditation the focused on pure possibility.  The meditation lead patients to focus on something they want to accomplish during they day.  Patients were to listen to the meditation and follow along as the meditation played to engage.  Education:  Stress Management, Discharge Planning.   Education Outcome: Acknowledges Education  Clinical Observations/Feedback:  Pt attended and participated in activity.    Victorino Sparrow, LRT/CTRS         Victorino Sparrow A 09/05/2019 11:39 AM
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
## 2019-09-05 NOTE — Progress Notes (Signed)
Discharge note:  D:  Pt verbalized readiness for discharge and denied SI/HI/AVH.   A: Discharge instructions reviewed with patient,  belongings returned, and prescriptions were given as applicable.    R: Pt  verbalized understanding of d/c instructions and stated his intent to be compliant with them.  Pt discharged to lobby without incident.

## 2019-09-05 NOTE — Tx Team (Signed)
Interdisciplinary Treatment and Diagnostic Plan Update  09/05/2019 Time of Session:  LABRANDON KNOCH MRN: [id number]  Principal Diagnosis: <principal problem not specified>  Secondary Diagnoses: Active Problems:   Bipolar 1 disorder (HCC)   Current Medications:  Current Facility-Administered Medications  Medication Dose Route Frequency Provider Last Rate Last Admin  . acetaminophen (TYLENOL) tablet 650 mg  650 mg Oral Q6H PRN Nira Conn A, NP   650 mg at 09/05/19 0735  . alum & mag hydroxide-simeth (MAALOX/MYLANTA) 200-200-20 MG/5ML suspension 30 mL  30 mL Oral Q4H PRN Nira Conn A, NP      . ARIPiprazole (ABILIFY) tablet 5 mg  5 mg Oral Daily Cobos, Rockey Situ, MD   5 mg at 09/05/19 0733  . citalopram (CELEXA) tablet 10 mg  10 mg Oral Daily Cobos, Rockey Situ, MD   10 mg at 09/05/19 3474  . hydrOXYzine (ATARAX/VISTARIL) tablet 25 mg  25 mg Oral Q6H PRN Cobos, Rockey Situ, MD      . loperamide (IMODIUM) capsule 2-4 mg  2-4 mg Oral PRN Cobos, Rockey Situ, MD      . risperiDONE (RISPERDAL M-TABS) disintegrating tablet 2 mg  2 mg Oral Q8H PRN Cobos, Rockey Situ, MD       And  . LORazepam (ATIVAN) tablet 1 mg  1 mg Oral PRN Cobos, Rockey Situ, MD       And  . ziprasidone (GEODON) injection 20 mg  20 mg Intramuscular PRN Cobos, Rockey Situ, MD      . LORazepam (ATIVAN) tablet 1 mg  1 mg Oral Q6H PRN Cobos, Rockey Situ, MD      . magnesium hydroxide (MILK OF MAGNESIA) suspension 30 mL  30 mL Oral Daily PRN Nira Conn A, NP      . multivitamin with minerals tablet 1 tablet  1 tablet Oral Daily Cobos, Rockey Situ, MD   1 tablet at 09/05/19 0732  . nicotine (NICODERM CQ - dosed in mg/24 hours) patch 21 mg  21 mg Transdermal Daily Antonieta Pert, MD      . ondansetron (ZOFRAN-ODT) disintegrating tablet 4 mg  4 mg Oral Q6H PRN Cobos, Rockey Situ, MD      . thiamine tablet 100 mg  100 mg Oral Daily Cobos, Rockey Situ, MD   100 mg at 09/05/19 0732  . traZODone (DESYREL) tablet 50 mg  50 mg Oral  QHS PRN Jackelyn Poling, NP       PTA Medications: Medications Prior to Admission  Medication Sig Dispense Refill Last Dose  . acetaminophen (TYLENOL) 325 MG tablet Take 650 mg by mouth every 6 (six) hours as needed for mild pain or headache.     . terbinafine (LAMISIL) 1 % cream Apply 1 application topically 2 (two) times daily. (Patient not taking: Reported on 09/03/2019) 14 g 0 Not Taking at Unknown time    Patient Stressors:    Patient Strengths:    Treatment Modalities: Medication Management, Group therapy, Case management,  1 to 1 session with clinician, Psychoeducation, Recreational therapy.   Physician Treatment Plan for Primary Diagnosis: <principal problem not specified> Long Term Goal(s): Improvement in symptoms so as ready for discharge Improvement in symptoms so as ready for discharge   Short Term Goals: Ability to identify changes in lifestyle to reduce recurrence of condition will improve Ability to verbalize feelings will improve Ability to disclose and discuss suicidal ideas Ability to demonstrate self-control will improve Ability to identify and develop effective coping behaviors will improve Ability  to maintain clinical measurements within normal limits will improve Ability to identify changes in lifestyle to reduce recurrence of condition will improve Ability to verbalize feelings will improve Ability to disclose and discuss suicidal ideas Ability to demonstrate self-control will improve Ability to identify and develop effective coping behaviors will improve Ability to maintain clinical measurements within normal limits will improve  Medication Management: Evaluate patient's response, side effects, and tolerance of medication regimen.  Therapeutic Interventions: 1 to 1 sessions, Unit Group sessions and Medication administration.  Evaluation of Outcomes: Progressing  Physician Treatment Plan for Secondary Diagnosis: Active Problems:   Bipolar 1 disorder  ([name])  Long Term Goal(s): Improvement in symptoms so as ready for discharge Improvement in symptoms so as ready for discharge   Short Term Goals: Ability to identify changes in lifestyle to reduce recurrence of condition will improve Ability to verbalize feelings will improve Ability to disclose and discuss suicidal ideas Ability to demonstrate self-control will improve Ability to identify and develop effective coping behaviors will improve Ability to maintain clinical measurements within normal limits will improve Ability to identify changes in lifestyle to reduce recurrence of condition will improve Ability to verbalize feelings will improve Ability to disclose and discuss suicidal ideas Ability to demonstrate self-control will improve Ability to identify and develop effective coping behaviors will improve Ability to maintain clinical measurements within normal limits will improve     Medication Management: Evaluate patient's response, side effects, and tolerance of medication regimen.  Therapeutic Interventions: 1 to 1 sessions, Unit Group sessions and Medication administration.  Evaluation of Outcomes: Progressing   RN Treatment Plan for Primary Diagnosis: <principal problem not specified> Long Term Goal(s): Knowledge of disease and therapeutic regimen to maintain health will improve  Short Term Goals: Ability to verbalize frustration and anger appropriately will improve, Ability to participate in decision making will improve, Ability to verbalize feelings will improve, Ability to disclose and discuss suicidal ideas, Ability to identify and develop effective coping behaviors will improve and Compliance with prescribed medications will improve  Medication Management: RN will administer medications as ordered by provider, will assess and evaluate patient's response and provide education to patient for prescribed medication. RN will report any adverse and/or side effects to prescribing  provider.  Therapeutic Interventions: 1 on 1 counseling sessions, Psychoeducation, Medication administration, Evaluate responses to treatment, Monitor vital signs and CBGs as ordered, Perform/monitor CIWA, COWS, AIMS and Fall Risk screenings as ordered, Perform wound care treatments as ordered.  Evaluation of Outcomes: Progressing   LCSW Treatment Plan for Primary Diagnosis: <principal problem not specified> Long Term Goal(s): Safe transition to appropriate next level of care at discharge, Engage patient in therapeutic group addressing interpersonal concerns.  Short Term Goals: Engage patient in aftercare planning with referrals and resources  Therapeutic Interventions: Assess for all discharge needs, 1 to 1 time with Social worker, Explore available resources and support systems, Assess for adequacy in community support network, Educate family and significant other(s) on suicide prevention, Complete Psychosocial Assessment, Interpersonal group therapy.  Evaluation of Outcomes: Progressing   Progress in Treatment: Attending groups: Yes. Participating in groups: Yes. Taking medication as prescribed: Yes. Toleration medication: Yes. Family/Significant other contact made: No, will contact:  the patient's grandmother Patient understands diagnosis: Yes. Discussing patient identified problems/goals with staff: Yes. Medical problems stabilized or resolved: Yes. Denies suicidal/homicidal ideation: Yes. Issues/concerns per patient self-inventory: No. Other:   New problem(s) identified: None   New Short Term/Long Term Goal(s): Detox, medication stabilization, elimination of SI thoughts, development  of comprehensive mental wellness plan.    Patient Goals:    Discharge Plan or Barriers: Patient plans to return home with friends. [facility].   Reason for Continuation of Hospitalization:  Anxiety Depression Medication stabilization Suicidal ideation  Estimated Length of Stay:  Attendees: Patient: 09/05/2019 9:05 AM  Physician: Dr. Nehemiah MassedFernando Cobos, MD 09/05/2019 9:05 AM  Nursing: Meriam SpragueBeverly.Kirtland BouchardK, RN 09/05/2019 9:05 AM  RN Care Manager: 09/05/2019 9:05 AM  Social Worker: Baldo DaubJolan Audrey Eller, LCSW 09/05/2019 9:05 AM  Recreational Therapist:  09/05/2019 9:05 AM  Other:  09/05/2019 9:05 AM  Other:  09/05/2019 9:05 AM  Other: 09/05/2019 9:05 AM    Scribe for Treatment Team: Maeola SarahJolan E Masoud Nyce, LCSWA 09/05/2019 9:05 AM
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
## 2019-09-06 NOTE — Progress Notes (Signed)
Spiritual care group on grief and loss facilitated by chaplain Jerene Pitch MDiv, B[facility]  Group Goal:  Support / Education around grief and loss Members engage in facilitated group support and psycho-social education.  Group Description:  Following introductions and group rules, group members engaged in facilitated group dialog and support around topic of loss, with particular support around experiences of loss in their lives. Group Identified types of loss (relationships / self / things) and identified patterns, circumstances, and changes that precipitate losses. Reflected on thoughts / feelings around loss, normalized grief responses, and recognized variety in grief experience.   Group noted Worden's four tasks of grief in discussion.  Group drew on Adlerian / Rogerian, narrative, MI, Patient Progress:  Was present throughout group.  Group provided support around very recent death of grandmother.  [name] processed relationship with grandmother and spoke with group about values he wishes to pass to his own children.

## 2020-01-07 ENCOUNTER — Other Ambulatory Visit: Payer: Self-pay

## 2020-01-07 ENCOUNTER — Emergency Department (HOSPITAL_COMMUNITY)
Admission: EM | Admit: 2020-01-07 | Discharge: 2020-01-07 | Payer: Medicaid Other | Attending: Emergency Medicine | Admitting: Emergency Medicine

## 2020-01-07 DIAGNOSIS — F17210 Nicotine dependence, cigarettes, uncomplicated: Secondary | ICD-10-CM | POA: Insufficient documentation

## 2020-01-07 DIAGNOSIS — R456 Violent behavior: Secondary | ICD-10-CM | POA: Diagnosis present

## 2020-01-07 DIAGNOSIS — R4689 Other symptoms and signs involving appearance and behavior: Secondary | ICD-10-CM

## 2020-01-07 NOTE — ED Triage Notes (Signed)
Pt is a 24 yr old; combative and in [employer] custody, hx of bipolar and medication non compliance. Pt was hitting his head on the ground. Pt is restrained and was given Im 5 versed and 5 haldol Im bilateral arm injections. V/s on arrival 134/84 hr 73, rr16, spo2 100.

## 2020-01-07 NOTE — ED Provider Notes (Signed)
[facility]-EMERGENCY DEPT Provider Note   CSN: [id number] Arrival date & time: 01/07/20  2204     History Chief Complaint  Patient presents with  . Aggressive Behavior    [name] is a 24 y.o. male.  Patient arrives to the ED after given IM sedation medications by EMS at the scene where he is being arrested for warrants hour out for his arrest.  Police states that patient was calm when they originally got to him.  When he found out that he was under arrest he started to act aggressive.  No loss of consciousness or trauma to the patient.  However due to increased aggression EMS was called and he was given IM Versed and Haldol.  Patiently is currently calm.  He has no complaints.  He is not hurting anywhere.  He denies any suicidal homicidal ideation.  He states that he is upset about being charged for these crimes.  The history is provided by the patient and the police.  Illness Severity:  Mild Onset quality:  Gradual Progression:  Improving Chronicity:  New Associated symptoms: no abdominal pain, no chest pain, no congestion, no cough, no diarrhea, no ear pain, no fatigue, no fever, no headaches, no loss of consciousness, no myalgias, no nausea, no rash, no rhinorrhea, no shortness of breath, no sore throat, no vomiting and no wheezing        Past Medical History:  Diagnosis Date  . Anxiety   . Depression     Patient Active Problem List   Diagnosis Date Noted  . Self-inflicted injury   . Bipolar 1 disorder (HCC) 09/02/2019    No past surgical history on file.     No family history on file.  Social History   Tobacco Use  . Smoking status: Current Every Day Smoker    Packs/day: 1.00    Years: 10.00    Pack years: 10.00    Types: Cigarettes  . Smokeless tobacco: Never Used  Substance Use Topics  . Alcohol use: Yes    Comment: "alot"  . Drug use: Yes    Types: Marijuana    Home Medications Prior to Admission medications     Medication Sig Start Date End Date Taking? Authorizing Provider  ARIPiprazole (ABILIFY) 5 MG tablet Take 1 tablet (5 mg total) by mouth daily. 09/06/19   Oneta Rack, NP  citalopram (CELEXA) 10 MG tablet Take 1 tablet (10 mg total) by mouth daily. 09/06/19   Oneta Rack, NP  nicotine (NICODERM CQ - DOSED IN MG/24 HOURS) 21 mg/24hr patch Place 1 patch (21 mg total) onto the skin daily. 09/06/19   Oneta Rack, NP  terbinafine (LAMISIL) 1 % cream Apply 1 application topically 2 (two) times daily. Patient not taking: Reported on 09/03/2019 11/05/18   Aviva Kluver B, PA-C  traZODone (DESYREL) 50 MG tablet Take 1 tablet (50 mg total) by mouth at bedtime as needed for sleep. 09/05/19   Oneta Rack, NP    Allergies    Zyprexa [olanzapine]  Review of Systems   Review of Systems  Constitutional: Negative for chills, fatigue and fever.  HENT: Negative for congestion, ear pain, rhinorrhea and sore throat.   Eyes: Negative for pain and visual disturbance.  Respiratory: Negative for cough, shortness of breath and wheezing.   Cardiovascular: Negative for chest pain and palpitations.  Gastrointestinal: Negative for abdominal pain, diarrhea, nausea and vomiting.  Genitourinary: Negative for dysuria and hematuria.  Musculoskeletal: Negative for  arthralgias, back pain and myalgias.  Skin: Negative for color change and rash.  Neurological: Negative for seizures, loss of consciousness, syncope and headaches.  Psychiatric/Behavioral: Positive for agitation (resolved). Negative for suicidal ideas. The patient is not nervous/anxious.   All other systems reviewed and are negative.   Physical Exam Updated Vital Signs  ED Triage Vitals [01/07/20 2219]  Enc Vitals Group     BP (!) 115/58     Pulse Rate 64     Resp 20     Temp 97.6 F (36.4 C)     Temp Source Oral     SpO2 98 %     Weight      Height      Head Circumference      Peak Flow      Pain Score      Pain Loc      Pain  Edu?      Excl. in GC?     Physical Exam Vitals and nursing note reviewed.  Constitutional:      General: He is not in acute distress.    Appearance: He is well-developed. He is not ill-appearing.  HENT:     Head: Normocephalic and atraumatic.     Nose: Nose normal.     Mouth/Throat:     Mouth: Mucous membranes are moist.  Eyes:     Extraocular Movements: Extraocular movements intact.     Conjunctiva/sclera: Conjunctivae normal.     Pupils: Pupils are equal, round, and reactive to light.  Cardiovascular:     Rate and Rhythm: Normal rate and regular rhythm.     Pulses: Normal pulses.     Heart sounds: Normal heart sounds. No murmur.  Pulmonary:     Effort: Pulmonary effort is normal. No respiratory distress.     Breath sounds: Normal breath sounds.  Abdominal:     Palpations: Abdomen is soft.     Tenderness: There is no abdominal tenderness.  Musculoskeletal:     Cervical back: Normal range of motion and neck supple.  Skin:    General: Skin is warm and dry.     Findings: Bruising (to nose) present.  Neurological:     General: No focal deficit present.     Mental Status: He is alert and oriented to person, place, and time.     Cranial Nerves: No cranial nerve deficit.     Sensory: No sensory deficit.     Motor: No weakness.     Coordination: Coordination normal.  Psychiatric:        Mood and Affect: Mood normal.        Behavior: Behavior normal.        Thought Content: Thought content normal.        Judgment: Judgment normal.     ED Results / Procedures / Treatments   Labs (all labs ordered are listed, but only abnormal results are displayed) Labs Reviewed - No data to display  EKG None  Radiology No results found.  Procedures Procedures (including critical care time)  Medications Ordered in ED Medications - No data to display  ED Course  I have reviewed the triage vital signs and the nursing notes.  Pertinent labs & imaging results that were  available during my care of the patient were reviewed by me and considered in my medical decision making (see chart for details).    MDM Rules/Calculators/A&P  [name] is a 24 year old male with history of anxiety, depression who presents to the ED after aggressive behavior with police.  Patient with normal vitals.  No fever.  Patient here for medical clearance after given IM Haldol and Versed on the scene the common down after patient was resisting being arrested.  Patient denies any pain.  Patient per police is acting normally and he was found to have warrants out for his arrest and once he was placed under arrest he started to act aggressive.  Patient was given medications to calm him down.  He currently appears calm when he understands that he needs to go police.  He has some old bruising to his nose that he says has been there.  Otherwise no signs of trauma.  Patient with normal vitals.  Was observed for about an hour without any change in his mental status.  He denied any suicidal homicidal ideation.  No hallucinations.  Discharged with police in good condition.  This chart was dictated using voice recognition software.  Despite best efforts to proofread,  errors can occur which can change the documentation meaning.    Final Clinical Impression(s) / ED Diagnoses Final diagnoses:  Aggressive behavior    Rx / DC Orders ED Discharge Orders    None       Lennice Sites, DO 01/07/20 2302

## 2021-05-11 ENCOUNTER — Emergency Department (HOSPITAL_BASED_OUTPATIENT_CLINIC_OR_DEPARTMENT_OTHER): Payer: Medicaid Other

## 2021-05-11 ENCOUNTER — Encounter (HOSPITAL_BASED_OUTPATIENT_CLINIC_OR_DEPARTMENT_OTHER): Payer: Self-pay | Admitting: Emergency Medicine

## 2021-05-11 ENCOUNTER — Emergency Department (HOSPITAL_BASED_OUTPATIENT_CLINIC_OR_DEPARTMENT_OTHER)
Admission: EM | Admit: 2021-05-11 | Discharge: 2021-05-11 | Disposition: A | Discharge status: Home / Self Care | Payer: Medicaid Other | Attending: Emergency Medicine | Admitting: Emergency Medicine

## 2021-05-11 ENCOUNTER — Other Ambulatory Visit: Payer: Self-pay

## 2021-05-11 DIAGNOSIS — M79671 Pain in right foot: Secondary | ICD-10-CM | POA: Diagnosis present

## 2021-05-11 DIAGNOSIS — Y9341 Activity, dancing: Secondary | ICD-10-CM | POA: Diagnosis not present

## 2021-05-11 DIAGNOSIS — F17210 Nicotine dependence, cigarettes, uncomplicated: Secondary | ICD-10-CM | POA: Insufficient documentation

## 2021-05-11 DIAGNOSIS — Y92838 Other recreation area as the place of occurrence of the external cause: Secondary | ICD-10-CM | POA: Diagnosis not present

## 2021-05-11 DIAGNOSIS — W52XXXA Crushed, pushed or stepped on by crowd or human stampede, initial encounter: Secondary | ICD-10-CM | POA: Diagnosis not present

## 2021-05-11 MED ORDER — ACETAMINOPHEN 325 MG PO TABS
ORAL_TABLET | ORAL | Status: AC
  Administered 2021-05-11: 650 mg via ORAL
  Filled 2021-05-11: qty 2

## 2021-05-11 MED ORDER — IBUPROFEN 200 MG PO TABS
ORAL_TABLET | ORAL | Status: AC
  Filled 2021-05-11: qty 3

## 2021-05-11 MED ORDER — IBUPROFEN 400 MG PO TABS
600.0000 mg | ORAL_TABLET | Freq: Once | ORAL | Status: AC
  Administered 2021-05-11: 600 mg via ORAL

## 2021-05-11 MED ORDER — ACETAMINOPHEN 325 MG PO TABS: 650.0000 mg | ORAL_TABLET | Freq: Once | ORAL | Status: AC
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
## 2021-05-11 NOTE — Discharge Instructions (Addendum)
There is no acute fracture. I recommend rest, ice, compression and elevation. Please use Tylenol or ibuprofen for pain.  You may use 600 mg ibuprofen every 6 hours or 1000 mg of Tylenol every 6 hours.  You may choose to alternate between the 2.  This would be most effective.  Not to exceed 4 g of Tylenol within 24 hours.  Not to exceed 3200 mg ibuprofen 24 hours.

## 2021-07-10 ENCOUNTER — Encounter (HOSPITAL_BASED_OUTPATIENT_CLINIC_OR_DEPARTMENT_OTHER): Payer: Self-pay | Admitting: Emergency Medicine

## 2021-07-10 ENCOUNTER — Other Ambulatory Visit: Payer: Self-pay

## 2021-07-10 DIAGNOSIS — S62511A Displaced fracture of proximal phalanx of right thumb, initial encounter for closed fracture: Secondary | ICD-10-CM | POA: Diagnosis not present

## 2021-07-10 DIAGNOSIS — M25561 Pain in right knee: Secondary | ICD-10-CM | POA: Insufficient documentation

## 2021-07-10 DIAGNOSIS — F17210 Nicotine dependence, cigarettes, uncomplicated: Secondary | ICD-10-CM | POA: Insufficient documentation

## 2021-07-10 DIAGNOSIS — S6991XA Unspecified injury of right wrist, hand and finger(s), initial encounter: Secondary | ICD-10-CM | POA: Diagnosis present

## 2021-07-10 DIAGNOSIS — Y92410 Unspecified street and highway as the place of occurrence of the external cause: Secondary | ICD-10-CM | POA: Insufficient documentation
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
## 2021-07-11 ENCOUNTER — Emergency Department (HOSPITAL_BASED_OUTPATIENT_CLINIC_OR_DEPARTMENT_OTHER): Payer: Medicaid Other

## 2021-07-11 ENCOUNTER — Emergency Department (HOSPITAL_BASED_OUTPATIENT_CLINIC_OR_DEPARTMENT_OTHER)
Admission: EM | Admit: 2021-07-11 | Discharge: 2021-07-11 | Disposition: A | Discharge status: Home / Self Care | Payer: Medicaid Other | Attending: Emergency Medicine | Admitting: Emergency Medicine

## 2021-07-11 DIAGNOSIS — M25561 Pain in right knee: Secondary | ICD-10-CM

## 2021-07-11 DIAGNOSIS — S62511A Displaced fracture of proximal phalanx of right thumb, initial encounter for closed fracture: Secondary | ICD-10-CM

## 2021-07-11 MED ORDER — IBUPROFEN 800 MG PO TABS
800.0000 mg | ORAL_TABLET | Freq: Once | ORAL | Status: AC
  Administered 2021-07-11: 800 mg via ORAL
  Filled 2021-07-11: qty 1

## 2021-07-11 NOTE — Discharge Instructions (Signed)
Follow up with hand in the office.   Take 4 over the counter ibuprofen tablets 3 times a day or 2 over-the-counter naproxen tablets twice a day for pain. Also take tylenol 1000mg (2 extra strength) four times a day.
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
## 2021-09-28 ENCOUNTER — Emergency Department (HOSPITAL_COMMUNITY): Payer: Medicaid Other

## 2021-09-28 ENCOUNTER — Emergency Department (HOSPITAL_COMMUNITY)
Admission: EM | Admit: 2021-09-28 | Discharge: 2021-09-28 | Disposition: A | Discharge status: Nursing Home (Includes ICF) | Payer: Medicaid Other | Attending: Emergency Medicine | Admitting: Emergency Medicine

## 2021-09-28 ENCOUNTER — Encounter (HOSPITAL_COMMUNITY): Payer: Self-pay

## 2021-09-28 ENCOUNTER — Other Ambulatory Visit: Payer: Self-pay

## 2021-09-28 DIAGNOSIS — Z23 Encounter for immunization: Secondary | ICD-10-CM | POA: Diagnosis not present

## 2021-09-28 DIAGNOSIS — S0181XA Laceration without foreign body of other part of head, initial encounter: Secondary | ICD-10-CM | POA: Insufficient documentation

## 2021-09-28 DIAGNOSIS — Y92838 Other recreation area as the place of occurrence of the external cause: Secondary | ICD-10-CM | POA: Diagnosis not present

## 2021-09-28 LAB — CBC WITH DIFFERENTIAL/PLATELET
Abs Immature Granulocytes: 0.04 10*3/uL (ref 0.00–0.07)
Basophils Absolute: 0.1 10*3/uL (ref 0.0–0.1)
Basophils Relative: 1 %
Eosinophils Absolute: 0.4 10*3/uL (ref 0.0–0.5)
Eosinophils Relative: 3 %
HCT: 41.1 % (ref 39.0–52.0)
Hemoglobin: 13.3 g/dL (ref 13.0–17.0)
Immature Granulocytes: 0 %
Lymphocytes Relative: 35 %
Lymphs Abs: 4.3 10*3/uL — ABNORMAL HIGH (ref 0.7–4.0)
MCH: 25.5 pg — ABNORMAL LOW (ref 26.0–34.0)
MCHC: 32.4 g/dL (ref 30.0–36.0)
MCV: 78.9 fL — ABNORMAL LOW (ref 80.0–100.0)
Monocytes Absolute: 1.1 10*3/uL — ABNORMAL HIGH (ref 0.1–1.0)
Monocytes Relative: 9 %
Neutro Abs: 6.5 10*3/uL (ref 1.7–7.7)
Neutrophils Relative %: 52 %
Platelets: 205 10*3/uL (ref 150–400)
RBC: 5.21 MIL/uL (ref 4.22–5.81)
RDW: 16.1 % — ABNORMAL HIGH (ref 11.5–15.5)
WBC: 12.3 10*3/uL — ABNORMAL HIGH (ref 4.0–10.5)
nRBC: 0 % (ref 0.0–0.2)

## 2021-09-28 LAB — BASIC METABOLIC PANEL
Anion gap: 12 (ref 5–15)
BUN: 17 mg/dL (ref 6–20)
CO2: 20 mmol/L — ABNORMAL LOW (ref 22–32)
Calcium: 9 mg/dL (ref 8.9–10.3)
Chloride: 107 mmol/L (ref 98–111)
Creatinine, Ser: 1.4 mg/dL — ABNORMAL HIGH (ref 0.61–1.24)
GFR, Estimated: >60 mL/min (ref >60)
Glucose, Bld: 120 mg/dL — ABNORMAL HIGH (ref 70–99)
Potassium: 3.4 mmol/L — ABNORMAL LOW (ref 3.5–5.1)
Sodium: 139 mmol/L (ref 135–145)

## 2021-09-28 LAB — I-STAT CHEM 8, ED
BUN: 16 mg/dL (ref 6–20)
Calcium, Ion: 1.15 mmol/L (ref 1.15–1.40)
Chloride: 107 mmol/L (ref 98–111)
Creatinine, Ser: 1.3 mg/dL — ABNORMAL HIGH (ref 0.61–1.24)
Glucose, Bld: 111 mg/dL — ABNORMAL HIGH (ref 70–99)
HCT: 39 % (ref 39.0–52.0)
Hemoglobin: 13.3 g/dL (ref 13.0–17.0)
Potassium: 3.5 mmol/L (ref 3.5–5.1)
Sodium: 141 mmol/L (ref 135–145)
TCO2: 23 mmol/L (ref 22–32)

## 2021-09-28 MED ORDER — FENTANYL CITRATE PF 50 MCG/ML IJ SOSY
50.0000 ug | PREFILLED_SYRINGE | Freq: Once | INTRAMUSCULAR | Status: AC
  Administered 2021-09-28: 50 ug via INTRAVENOUS
  Filled 2021-09-28: qty 1

## 2021-09-28 MED ORDER — HYDROMORPHONE HCL 1 MG/ML IJ SOLN
0.5000 mg | Freq: Once | INTRAMUSCULAR | Status: AC
  Administered 2021-09-28: 0.5 mg via INTRAVENOUS
  Filled 2021-09-28: qty 1

## 2021-09-28 MED ORDER — TETANUS-DIPHTH-ACELL PERTUSSIS 5-2.5-18.5 LF-MCG/0.5 IM SUSY
0.5000 mL | PREFILLED_SYRINGE | Freq: Once | INTRAMUSCULAR | Status: AC
  Administered 2021-09-28: 0.5 mL via INTRAMUSCULAR
  Filled 2021-09-28: qty 0.5

## 2021-09-28 MED ORDER — CEFAZOLIN SODIUM-DEXTROSE 2-4 GM/100ML-% IV SOLN
2.0000 g | Freq: Once | INTRAVENOUS | Status: AC
  Administered 2021-09-28: 2 g via INTRAVENOUS
  Filled 2021-09-28: qty 100

## 2021-09-28 MED ORDER — ONDANSETRON HCL 4 MG/2ML IJ SOLN
INTRAMUSCULAR | Status: AC
  Filled 2021-09-28: qty 2

## 2021-09-28 NOTE — ED Notes (Signed)
Visual acuity on this patient was left eye 20/800, rt eye 20/25, both 20/30
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
## 2021-09-28 NOTE — ED Notes (Signed)
Ophthalmology paged
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
## 2022-01-16 ENCOUNTER — Emergency Department (HOSPITAL_BASED_OUTPATIENT_CLINIC_OR_DEPARTMENT_OTHER)
Admission: EM | Admit: 2022-01-16 | Discharge: 2022-01-17 | Disposition: A | Discharge status: Home / Self Care | Payer: Medicaid Other | Attending: Emergency Medicine | Admitting: Emergency Medicine

## 2022-01-16 ENCOUNTER — Encounter (HOSPITAL_BASED_OUTPATIENT_CLINIC_OR_DEPARTMENT_OTHER): Payer: Self-pay | Admitting: Urology

## 2022-01-16 ENCOUNTER — Emergency Department (HOSPITAL_BASED_OUTPATIENT_CLINIC_OR_DEPARTMENT_OTHER): Payer: Medicaid Other

## 2022-01-16 DIAGNOSIS — M25562 Pain in left knee: Secondary | ICD-10-CM | POA: Insufficient documentation

## 2022-01-16 DIAGNOSIS — Y9302 Activity, running: Secondary | ICD-10-CM | POA: Diagnosis not present

## 2022-01-16 DIAGNOSIS — X501XXA Overexertion from prolonged static or awkward postures, initial encounter: Secondary | ICD-10-CM | POA: Insufficient documentation

## 2022-01-16 MED ORDER — KETOROLAC TROMETHAMINE 15 MG/ML IJ SOLN
15.0000 mg | Freq: Once | INTRAMUSCULAR | Status: DC
  Filled 2022-01-16: qty 1

## 2022-01-16 MED ORDER — ACETAMINOPHEN 500 MG PO TABS
1000.0000 mg | ORAL_TABLET | Freq: Once | ORAL | Status: AC
  Administered 2022-01-16: 1000 mg via ORAL
  Filled 2022-01-16: qty 2

## 2022-01-16 MED ORDER — KETOROLAC TROMETHAMINE 15 MG/ML IJ SOLN
15.0000 mg | Freq: Once | INTRAMUSCULAR | Status: AC
Start: 2022-01-16 — End: 2022-01-16
  Administered 2022-01-16: 15 mg via INTRAMUSCULAR

## 2022-01-16 NOTE — Discharge Instructions (Signed)
Follow up with ortho in the office.  Call them tomorrow to set up an appointment ?Take 4 over the counter ibuprofen tablets 3 times a day or 2 over-the-counter naproxen tablets twice a day for pain. ?Also take tylenol 1000mg (2 extra strength) four times a day.  ? ? ?

## 2022-01-16 NOTE — ED Triage Notes (Signed)
Twisted left knee yesterday while running down the stairs  ?Pain with weight bearing  ? ?

## 2022-01-16 NOTE — ED Provider Notes (Signed)
?[name] ?Provider Note ? ? ?CSN: [id number] ?Arrival date & time: 01/16/22  2228 ? ?  ? ?History ? ?Chief Complaint  ?Patient presents with  ? Knee Injury  ? ? ?[name] is a 26 y.o. male. ? ?26 yo M with a chief complaint of left knee pain.  The patient had a fall couple days ago and since then has had difficulty bearing weight onto that leg.  He had another fall today when he tried to bear weight and so decided to come to the emergency department for evaluation.  He has never had trouble with this knee before.  Denies any other injury in the fall. ? ? ? ?  ? ?Home Medications ?Prior to Admission medications   ?Not on File  ?   ? ?Allergies    ?Zyprexa [olanzapine]   ? ?Review of Systems   ?Review of Systems ? ?Physical Exam ?Updated Vital Signs ?BP 124/67 (BP Location: Right Arm)   Pulse 84   Temp 98.1 ?F (36.7 ?C) (Oral)   Resp 20   Ht 6\' 5"  (1.956 m)   Wt 104.3 kg   SpO2 98%   BMI 27.27 kg/m?  ?Physical Exam ?Vitals and nursing note reviewed.  ?Constitutional:   ?   Appearance: He is well-developed.  ?HENT:  ?   Head: Normocephalic and atraumatic.  ?Eyes:  ?   Pupils: Pupils are equal, round, and reactive to light.  ?Neck:  ?   Vascular: No JVD.  ?Cardiovascular:  ?   Rate and Rhythm: Normal rate and regular rhythm.  ?   Heart sounds: No murmur heard. ?  No friction rub. No gallop.  ?Pulmonary:  ?   Effort: No respiratory distress.  ?   Breath sounds: No wheezing.  ?Abdominal:  ?   General: There is no distension.  ?   Tenderness: There is no abdominal tenderness. There is no guarding or rebound.  ?Musculoskeletal:     ?   General: Normal range of motion.  ?   Cervical back: Normal range of motion and neck supple.  ?   Comments: Swelling and tenderness to the left knee.  Pain with full extension.  Pulse motor and sensation intact distally.  For me I have some ligamentous laxity with anterior drawer test.  ?Skin: ?   Coloration: Skin is not pale.  ?   Findings: No  rash.  ?Neurological:  ?   Mental Status: He is alert and oriented to person, place, and time.  ?Psychiatric:     ?   Behavior: Behavior normal.  ? ? ?ED Results / Procedures / Treatments   ?Labs ?(all labs ordered are listed, but only abnormal results are displayed) ?Labs Reviewed - No data to display ? ?EKG ?None ? ?Radiology ?DG Knee Complete 4 Views Left ? ?Result Date: 01/16/2022 ?CLINICAL DATA:  Fall, twisted knee EXAM: LEFT KNEE - COMPLETE 4+ VIEW COMPARISON:  None Available. FINDINGS: No evidence of fracture, dislocation, or joint effusion. No evidence of arthropathy or other focal bone abnormality. Soft tissues are unremarkable. IMPRESSION: Negative. Electronically Signed   By: Rolm Baptise M.D.   On: 01/16/2022 23:01   ? ?Procedures ?Procedures  ? ? ?Medications Ordered in ED ?Medications  ?ketorolac (TORADOL) 15 MG/ML injection 15 mg (has no administration in time range)  ?acetaminophen (TYLENOL) tablet 1,000 mg (has no administration in time range)  ? ? ?ED Course/ Medical Decision Making/ A&P ?  ?                        ?  Medical Decision Making ?Amount and/or Complexity of Data Reviewed ?Radiology: ordered. ? ?Risk ?OTC drugs. ?Prescription drug management. ? ? ?26 yo M with a chief complaints of left knee pain.  The patient had a reported twisting mechanism and then fell down some stairs.  This was a couple days ago.  On exam he has some give with anterior drawer test which is concerning for possible ligamentous injury.  Plain film independently interpreted by me without fracture or dislocation.  Will place in knee immobilizer crutches given orthopedic follow-up. ? ?11:19 PM:  I have discussed the diagnosis/risks/treatment options with the patient.  Evaluation and diagnostic testing in the emergency department does not suggest an emergent condition requiring admission or immediate intervention beyond what has been performed at this time.  They will follow up with  PCP. We also discussed returning to the  ED immediately if new or worsening sx occur. We discussed the sx which are most concerning (e.g., sudden worsening pain, fever, inability to tolerate by mouth) that necessitate immediate return. Medications administered to the patient during their visit and any new prescriptions provided to the patient are listed below. ? ?Medications given during this visit ?Medications  ?ketorolac (TORADOL) 15 MG/ML injection 15 mg (has no administration in time range)  ?acetaminophen (TYLENOL) tablet 1,000 mg (has no administration in time range)  ? ? ? ?The patient appears reasonably screen and/or stabilized for discharge and I doubt any other medical condition or other [facility] requiring further screening, evaluation, or treatment in the ED at this time prior to discharge.  ? ? ? ? ? ? ? ? ?Final Clinical Impression(s) / ED Diagnoses ?Final diagnoses:  ?Acute pain of left knee  ? ? ?Rx / DC Orders ?ED Discharge Orders   ? ? None  ? ?  ? ? ?  ?Deno Etienne, DO ?01/16/22 2319 ? ?

## 2023-02-09 IMAGING — DX DG KNEE COMPLETE 4+V*L*
4 series · 4 of 4 positions shown · non-contrast
Comparison: None Available.

CLINICAL DATA: Fall, twisted knee

EXAM:
LEFT KNEE - COMPLETE 4+ VIEW

[knee ap]
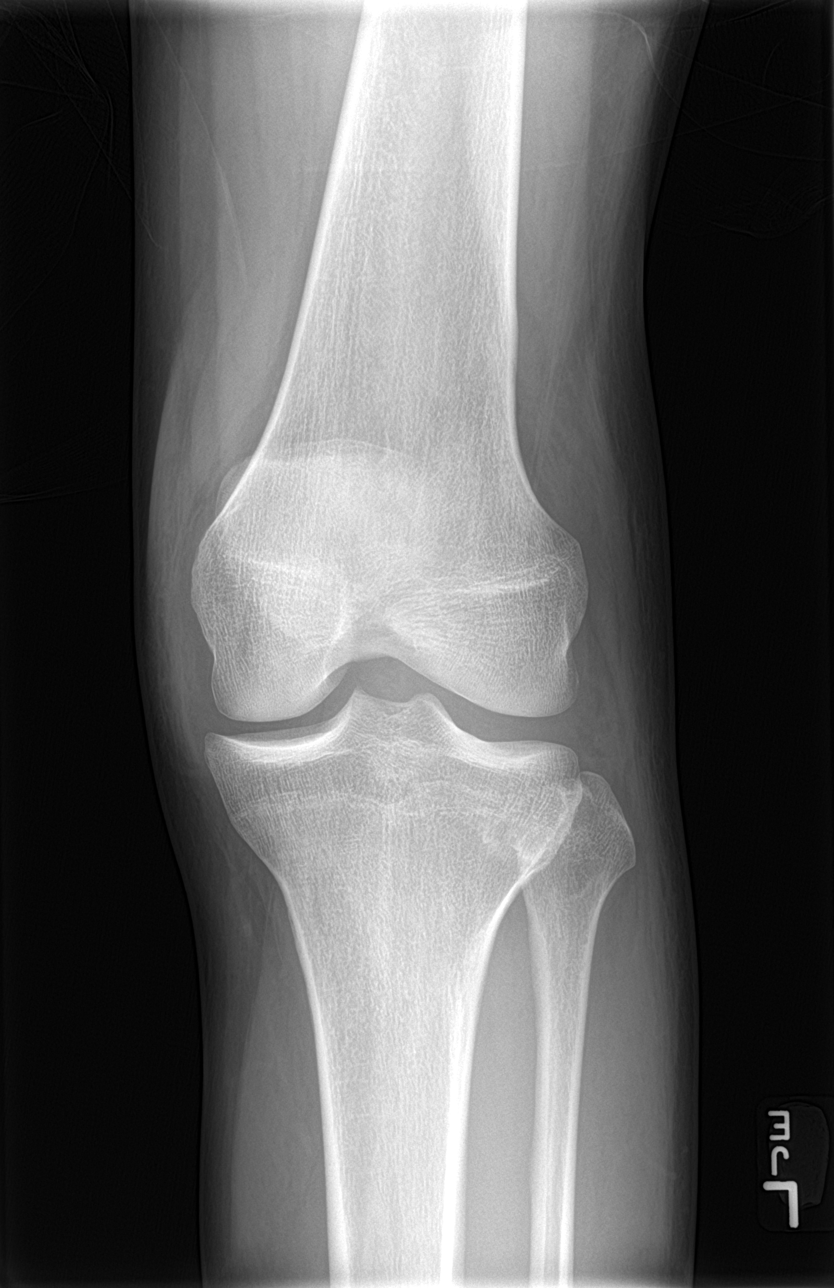

[knee lat]
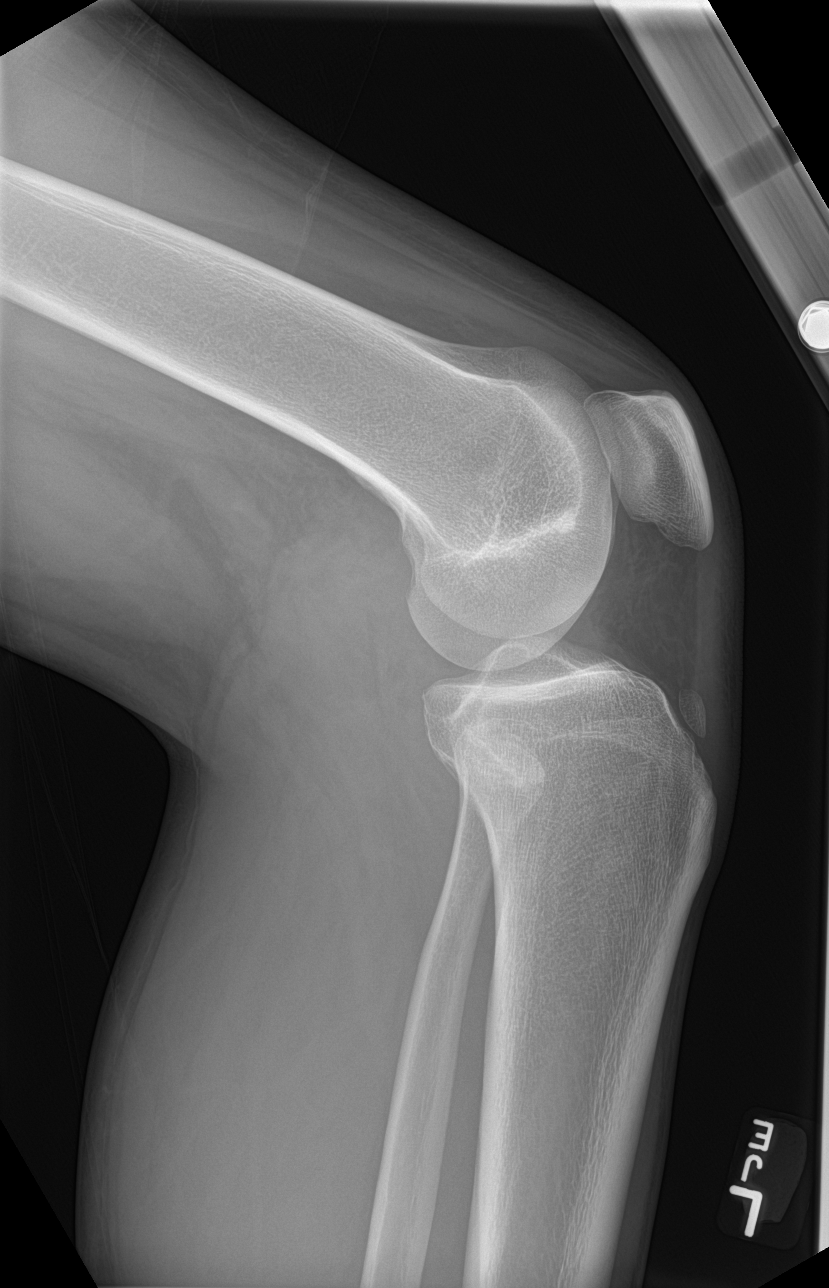

[knee obl (1 of 2)]
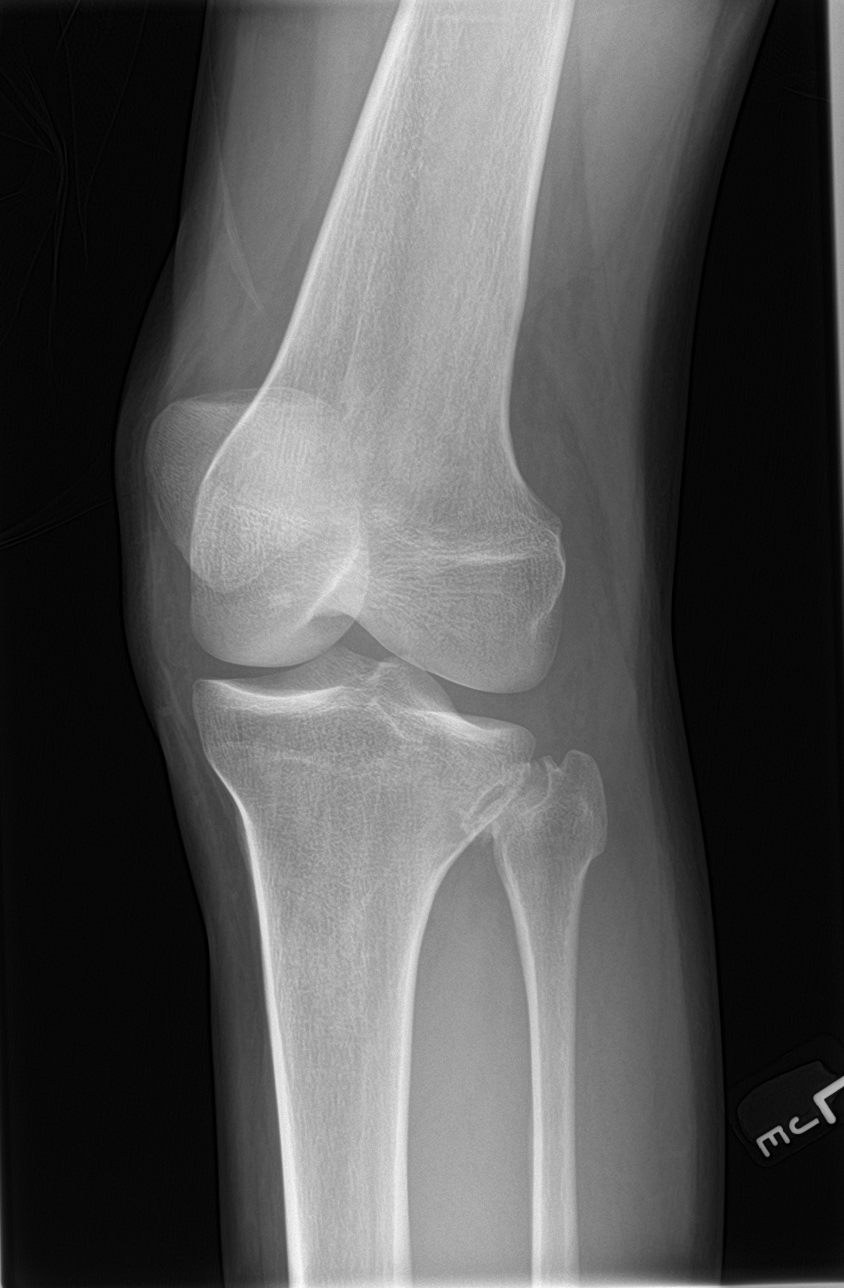

[knee obl (2 of 2)]
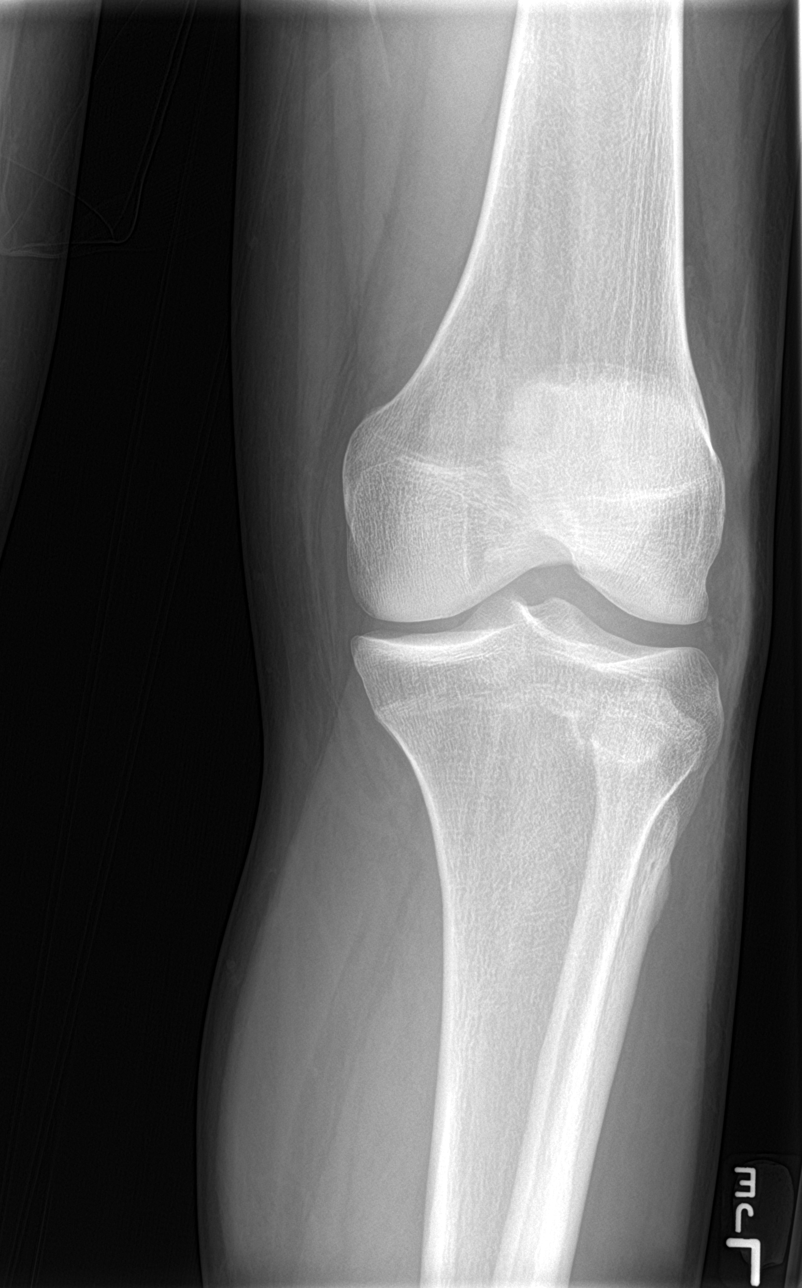

[4 of 4 positions shown; findings below may reference images not displayed]

FINDINGS: No evidence of fracture, dislocation, or joint effusion. No evidence
of arthropathy or other focal bone abnormality. Soft tissues are
unremarkable.
IMPRESSION: Negative.

## 2023-02-18 ENCOUNTER — Other Ambulatory Visit: Payer: Self-pay

## 2023-02-18 ENCOUNTER — Encounter (HOSPITAL_COMMUNITY): Payer: Self-pay

## 2023-02-18 ENCOUNTER — Emergency Department (HOSPITAL_COMMUNITY)
Admission: EM | Admit: 2023-02-18 | Discharge: 2023-02-18 | Disposition: A | Discharge status: Home / Self Care | Payer: Medicaid Other | Attending: Emergency Medicine | Admitting: Emergency Medicine

## 2023-02-18 DIAGNOSIS — S40812A Abrasion of left upper arm, initial encounter: Secondary | ICD-10-CM | POA: Insufficient documentation

## 2023-02-18 DIAGNOSIS — S50812A Abrasion of left forearm, initial encounter: Secondary | ICD-10-CM | POA: Insufficient documentation

## 2023-02-18 DIAGNOSIS — Z23 Encounter for immunization: Secondary | ICD-10-CM | POA: Insufficient documentation

## 2023-02-18 DIAGNOSIS — Y92410 Unspecified street and highway as the place of occurrence of the external cause: Secondary | ICD-10-CM | POA: Insufficient documentation

## 2023-02-18 MED ORDER — TETANUS-DIPHTH-ACELL PERTUSSIS 5-2.5-18.5 LF-MCG/0.5 IM SUSY
0.5000 mL | PREFILLED_SYRINGE | Freq: Once | INTRAMUSCULAR | Status: AC
  Administered 2023-02-18: 0.5 mL via INTRAMUSCULAR
  Filled 2023-02-18: qty 0.5

## 2023-02-18 MED ORDER — BACITRACIN ZINC 500 UNIT/GM EX OINT
TOPICAL_OINTMENT | Freq: Two times a day (BID) | CUTANEOUS | Status: DC
Start: 2023-02-18 — End: 2023-02-18
  Administered 2023-02-18: 1 via TOPICAL
  Filled 2023-02-18: qty 3.6
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
## 2023-02-18 NOTE — Discharge Instructions (Signed)
As we discussed, your workup in the ER today was reassuring for acute findings.  I recommend that you clean your wounds with soap and water in the shower or bathtub 1-2 times every day.  Then apply the topical bacitracin ointment that we have given you in a nonadherent dressing.  Do not use any more hydrogen peroxide as this can delay wound healing.  You may take Tylenol/ibuprofen as needed for pain.  Return if development of any new or worsening symptoms.

## 2023-12-05 ENCOUNTER — Encounter (HOSPITAL_COMMUNITY): Payer: Self-pay

## 2023-12-05 ENCOUNTER — Other Ambulatory Visit: Payer: Self-pay

## 2023-12-05 ENCOUNTER — Observation Stay (HOSPITAL_BASED_OUTPATIENT_CLINIC_OR_DEPARTMENT_OTHER): Admitting: Certified Registered Nurse Anesthetist

## 2023-12-05 ENCOUNTER — Observation Stay (HOSPITAL_COMMUNITY): Admission: EM | Admit: 2023-12-05 | Discharge: 2023-12-05 | Disposition: A | Discharge status: Home / Self Care

## 2023-12-05 ENCOUNTER — Inpatient Hospital Stay: Admit: 2023-12-05

## 2023-12-05 ENCOUNTER — Observation Stay (HOSPITAL_COMMUNITY): Admitting: Certified Registered Nurse Anesthetist

## 2023-12-05 ENCOUNTER — Encounter (HOSPITAL_COMMUNITY)
Admission: EM | Disposition: A | Discharge status: Home / Self Care | Payer: Self-pay | Source: Home / Self Care | Attending: Emergency Medicine

## 2023-12-05 ENCOUNTER — Emergency Department (HOSPITAL_COMMUNITY)

## 2023-12-05 DIAGNOSIS — S02652A Fracture of angle of left mandible, initial encounter for closed fracture: Secondary | ICD-10-CM | POA: Diagnosis not present

## 2023-12-05 DIAGNOSIS — Z79899 Other long term (current) drug therapy: Secondary | ICD-10-CM | POA: Insufficient documentation

## 2023-12-05 DIAGNOSIS — S02651A Fracture of angle of right mandible, initial encounter for closed fracture: Secondary | ICD-10-CM | POA: Diagnosis present

## 2023-12-05 DIAGNOSIS — S02609A Fracture of mandible, unspecified, initial encounter for closed fracture: Principal | ICD-10-CM | POA: Diagnosis present

## 2023-12-05 HISTORY — PX: CLOSED REDUCTION MANDIBLE WITH MANDIBULOMA: SHX5313

## 2023-12-05 LAB — CBC WITH DIFFERENTIAL/PLATELET
Abs Immature Granulocytes: 0.04 10*3/uL (ref 0.00–0.07)
Basophils Absolute: 0.1 10*3/uL (ref 0.0–0.1)
Basophils Relative: 0 %
Eosinophils Absolute: 0.1 10*3/uL (ref 0.0–0.5)
Eosinophils Relative: 0 %
HCT: 42 % (ref 39.0–52.0)
Hemoglobin: 13.1 g/dL (ref 13.0–17.0)
Immature Granulocytes: 0 %
Lymphocytes Relative: 16 %
Lymphs Abs: 2.3 10*3/uL (ref 0.7–4.0)
MCH: 24.3 pg — ABNORMAL LOW (ref 26.0–34.0)
MCHC: 31.2 g/dL (ref 30.0–36.0)
MCV: 77.8 fL — ABNORMAL LOW (ref 80.0–100.0)
Monocytes Absolute: 1.2 10*3/uL — ABNORMAL HIGH (ref 0.1–1.0)
Monocytes Relative: 8 %
Neutro Abs: 10.7 10*3/uL — ABNORMAL HIGH (ref 1.7–7.7)
Neutrophils Relative %: 76 %
Platelets: 205 10*3/uL (ref 150–400)
RBC: 5.4 MIL/uL (ref 4.22–5.81)
RDW: 16.7 % — ABNORMAL HIGH (ref 11.5–15.5)
WBC: 14.3 10*3/uL — ABNORMAL HIGH (ref 4.0–10.5)
nRBC: 0 % (ref 0.0–0.2)

## 2023-12-05 LAB — RAPID URINE DRUG SCREEN, HOSP PERFORMED
Amphetamines: NOT DETECTED
Barbiturates: NOT DETECTED
Benzodiazepines: NOT DETECTED
Cocaine: POSITIVE — AB
Opiates: NOT DETECTED
Tetrahydrocannabinol: POSITIVE — AB

## 2023-12-05 LAB — BASIC METABOLIC PANEL WITH GFR
Anion gap: 9 (ref 5–15)
BUN: 9 mg/dL (ref 6–20)
CO2: 24 mmol/L (ref 22–32)
Calcium: 8.8 mg/dL — ABNORMAL LOW (ref 8.9–10.3)
Chloride: 108 mmol/L (ref 98–111)
Creatinine, Ser: 1.31 mg/dL — ABNORMAL HIGH (ref 0.61–1.24)
GFR, Estimated: >60 mL/min (ref >60)
Glucose, Bld: 100 mg/dL — ABNORMAL HIGH (ref 70–99)
Potassium: 3.7 mmol/L (ref 3.5–5.1)
Sodium: 141 mmol/L (ref 135–145)

## 2023-12-05 LAB — ETHANOL: Alcohol, Ethyl (B): <10 mg/dL (ref <10)

## 2023-12-05 SURGERY — CLOSED REDUCTION, MANDIBLE, WITH ARCH BAR APPLICATION AND INTERMAXILLARY FIXATION
Anesthesia: General | Laterality: Bilateral

## 2023-12-05 SURGERY — CLOSED REDUCTION, MANDIBLE, WITH ARCH BAR APPLICATION AND INTERMAXILLARY FIXATION
Anesthesia: General

## 2023-12-05 MED ORDER — ACETAMINOPHEN 10 MG/ML IV SOLN
INTRAVENOUS | Status: AC
  Filled 2023-12-05: qty 100

## 2023-12-05 MED ORDER — ACETAMINOPHEN 10 MG/ML IV SOLN
INTRAVENOUS | Status: DC | PRN
  Administered 2023-12-05: 1000 mg via INTRAVENOUS

## 2023-12-05 MED ORDER — ACETAMINOPHEN 160 MG/5ML PO SUSP: 1000.0000 mg | Freq: Four times a day (QID) | ORAL | 0 refills | Status: AC

## 2023-12-05 MED ORDER — DIPHENHYDRAMINE HCL 50 MG/ML IJ SOLN: 25.0000 mg | Freq: Four times a day (QID) | INTRAMUSCULAR | Status: DC | PRN

## 2023-12-05 MED ORDER — SUCCINYLCHOLINE CHLORIDE 200 MG/10ML IV SOSY
PREFILLED_SYRINGE | INTRAVENOUS | Status: AC
  Filled 2023-12-05: qty 10

## 2023-12-05 MED ORDER — DEXMEDETOMIDINE HCL IN NACL 200 MCG/50ML IV SOLN
INTRAVENOUS | Status: DC | PRN
  Administered 2023-12-05: 20 ug via INTRAVENOUS
  Administered 2023-12-05 (×3): 8 ug via INTRAVENOUS

## 2023-12-05 MED ORDER — DIPHENHYDRAMINE HCL 25 MG PO CAPS: 25.0000 mg | ORAL_CAPSULE | Freq: Four times a day (QID) | ORAL | Status: DC | PRN

## 2023-12-05 MED ORDER — CHLORHEXIDINE GLUCONATE 0.12 % MT SOLN: 15.0000 mL | Freq: Two times a day (BID) | OROMUCOSAL | 0 refills | Status: AC

## 2023-12-05 MED ORDER — FENTANYL CITRATE PF 50 MCG/ML IJ SOSY
50.0000 ug | PREFILLED_SYRINGE | Freq: Once | INTRAMUSCULAR | Status: AC
  Administered 2023-12-05: 50 ug via INTRAVENOUS
  Filled 2023-12-05: qty 1

## 2023-12-05 MED ORDER — PROPOFOL 10 MG/ML IV BOLUS
INTRAVENOUS | Status: DC | PRN
  Administered 2023-12-05: 100 mg via INTRAVENOUS
  Administered 2023-12-05: 50 mg via INTRAVENOUS
  Administered 2023-12-05: 250 mg via INTRAVENOUS
  Administered 2023-12-05: 80 mg via INTRAVENOUS

## 2023-12-05 MED ORDER — POLYETHYLENE GLYCOL 3350 17 G PO PACK: 17.0000 g | PACK | Freq: Every day | ORAL | 0 refills | Status: AC

## 2023-12-05 MED ORDER — LACTATED RINGERS IV SOLN: INTRAVENOUS | Status: DC

## 2023-12-05 MED ORDER — PROPOFOL 10 MG/ML IV BOLUS
INTRAVENOUS | Status: AC
  Filled 2023-12-05: qty 20

## 2023-12-05 MED ORDER — DEXAMETHASONE SODIUM PHOSPHATE 10 MG/ML IJ SOLN
INTRAMUSCULAR | Status: AC
  Filled 2023-12-05: qty 1

## 2023-12-05 MED ORDER — CHLORHEXIDINE GLUCONATE 0.12 % MT SOLN
15.0000 mL | Freq: Two times a day (BID) | OROMUCOSAL | Status: DC
  Administered 2023-12-05: 15 mL via OROMUCOSAL
  Filled 2023-12-05: qty 15

## 2023-12-05 MED ORDER — ACETAMINOPHEN 160 MG/5ML PO SOLN
1000.0000 mg | Freq: Four times a day (QID) | ORAL | Status: DC
  Administered 2023-12-05: 1000 mg via ORAL
  Filled 2023-12-05 (×2): qty 40.6

## 2023-12-05 MED ORDER — 0.9 % SODIUM CHLORIDE (POUR BTL) OPTIME
TOPICAL | Status: DC | PRN
  Administered 2023-12-05: 1000 mL

## 2023-12-05 MED ORDER — OXYCODONE HCL 5 MG/5ML PO SOLN: 5.0000 mg | ORAL | Status: DC | PRN

## 2023-12-05 MED ORDER — FENTANYL CITRATE (PF) 250 MCG/5ML IJ SOLN
INTRAMUSCULAR | Status: AC
  Filled 2023-12-05: qty 5

## 2023-12-05 MED ORDER — DEXTROSE IN LACTATED RINGERS 5 % IV SOLN: INTRAVENOUS | Status: DC

## 2023-12-05 MED ORDER — MIDAZOLAM HCL 2 MG/2ML IJ SOLN
INTRAMUSCULAR | Status: DC | PRN
  Administered 2023-12-05: 2 mg via INTRAVENOUS

## 2023-12-05 MED ORDER — HYDROMORPHONE HCL 1 MG/ML IJ SOLN: 0.2500 mg | INTRAMUSCULAR | Status: DC | PRN

## 2023-12-05 MED ORDER — OXYMETAZOLINE HCL 0.05 % NA SOLN
NASAL | Status: AC
  Filled 2023-12-05: qty 30

## 2023-12-05 MED ORDER — ENSURE ENLIVE PO LIQD: 237.0000 mL | Freq: Three times a day (TID) | ORAL | Status: DC

## 2023-12-05 MED ORDER — SODIUM CHLORIDE 0.9 % IV SOLN
1.5000 g | Freq: Once | INTRAVENOUS | Status: AC
  Administered 2023-12-05: 1.5 g via INTRAVENOUS
  Filled 2023-12-05: qty 4

## 2023-12-05 MED ORDER — DROPERIDOL 2.5 MG/ML IJ SOLN: 0.6250 mg | Freq: Once | INTRAMUSCULAR | Status: DC | PRN

## 2023-12-05 MED ORDER — ONDANSETRON HCL 4 MG/2ML IJ SOLN
INTRAMUSCULAR | Status: DC | PRN
  Administered 2023-12-05: 4 mg via INTRAVENOUS

## 2023-12-05 MED ORDER — ONDANSETRON 4 MG PO TBDP: 4.0000 mg | ORAL_TABLET | Freq: Three times a day (TID) | ORAL | 0 refills | Status: AC | PRN

## 2023-12-05 MED ORDER — ONDANSETRON HCL 4 MG/2ML IJ SOLN
INTRAMUSCULAR | Status: AC
  Filled 2023-12-05: qty 2

## 2023-12-05 MED ORDER — ONDANSETRON HCL 4 MG/2ML IJ SOLN: 4.0000 mg | Freq: Four times a day (QID) | INTRAMUSCULAR | Status: DC | PRN

## 2023-12-05 MED ORDER — DEXMEDETOMIDINE HCL IN NACL 80 MCG/20ML IV SOLN
INTRAVENOUS | Status: AC
  Filled 2023-12-05: qty 20

## 2023-12-05 MED ORDER — CHLORHEXIDINE GLUCONATE 0.12 % MT SOLN: 15.0000 mL | Freq: Once | OROMUCOSAL | Status: DC

## 2023-12-05 MED ORDER — LACTATED RINGERS IV SOLN: INTRAVENOUS | Status: DC | PRN

## 2023-12-05 MED ORDER — IBUPROFEN 100 MG/5ML PO SUSP: 600.0000 mg | Freq: Four times a day (QID) | ORAL | 0 refills | Status: AC

## 2023-12-05 MED ORDER — ONDANSETRON 4 MG PO TBDP: 4.0000 mg | ORAL_TABLET | Freq: Four times a day (QID) | ORAL | Status: DC | PRN

## 2023-12-05 MED ORDER — MEPERIDINE HCL 25 MG/ML IJ SOLN: 6.2500 mg | INTRAMUSCULAR | Status: DC | PRN

## 2023-12-05 MED ORDER — GLYCOPYRROLATE 0.2 MG/ML IJ SOLN
INTRAMUSCULAR | Status: DC | PRN
  Administered 2023-12-05: .2 mg via INTRAVENOUS

## 2023-12-05 MED ORDER — BUPIVACAINE-EPINEPHRINE (PF) 0.25% -1:200000 IJ SOLN
INTRAMUSCULAR | Status: AC
  Filled 2023-12-05: qty 30

## 2023-12-05 MED ORDER — BUPIVACAINE-EPINEPHRINE 0.25% -1:200000 IJ SOLN
INTRAMUSCULAR | Status: DC | PRN
  Administered 2023-12-05: 11 mL

## 2023-12-05 MED ORDER — DEXAMETHASONE SODIUM PHOSPHATE 10 MG/ML IJ SOLN
INTRAMUSCULAR | Status: DC | PRN
  Administered 2023-12-05: 10 mg via INTRAVENOUS

## 2023-12-05 MED ORDER — MIDAZOLAM HCL 2 MG/2ML IJ SOLN
INTRAMUSCULAR | Status: AC
  Filled 2023-12-05: qty 2

## 2023-12-05 MED ORDER — FENTANYL CITRATE (PF) 250 MCG/5ML IJ SOLN
INTRAMUSCULAR | Status: DC | PRN
Start: 2023-12-05 — End: 2023-12-05
  Administered 2023-12-05: 50 ug via INTRAVENOUS
  Administered 2023-12-05: 100 ug via INTRAVENOUS
  Administered 2023-12-05 (×2): 50 ug via INTRAVENOUS

## 2023-12-05 MED ORDER — ROCURONIUM BROMIDE 10 MG/ML (PF) SYRINGE
PREFILLED_SYRINGE | INTRAVENOUS | Status: DC | PRN
  Administered 2023-12-05: 70 mg via INTRAVENOUS

## 2023-12-05 MED ORDER — OXYCODONE HCL 5 MG/5ML PO SOLN: 5.0000 mg | Freq: Four times a day (QID) | ORAL | 0 refills | Status: AC | PRN

## 2023-12-05 MED ORDER — SUGAMMADEX SODIUM 200 MG/2ML IV SOLN
INTRAVENOUS | Status: DC | PRN
  Administered 2023-12-05 (×2): 200 mg via INTRAVENOUS

## 2023-12-05 MED ORDER — ORAL CARE MOUTH RINSE: 15.0000 mL | Freq: Once | OROMUCOSAL | Status: DC

## 2023-12-05 MED ORDER — LIDOCAINE 2% (20 MG/ML) 5 ML SYRINGE
INTRAMUSCULAR | Status: AC
  Filled 2023-12-05: qty 5

## 2023-12-05 MED ORDER — KETOROLAC TROMETHAMINE 15 MG/ML IJ SOLN
INTRAMUSCULAR | Status: DC | PRN
Start: 2023-12-05 — End: 2023-12-05
  Administered 2023-12-05: 15 mg via INTRAVENOUS

## 2023-12-05 MED ORDER — IBUPROFEN 100 MG/5ML PO SUSP
800.0000 mg | Freq: Four times a day (QID) | ORAL | Status: DC
Start: 2023-12-05 — End: 2023-12-05
  Filled 2023-12-05 (×3): qty 40

## 2023-12-05 MED ORDER — KETOROLAC TROMETHAMINE 30 MG/ML IJ SOLN
INTRAMUSCULAR | Status: AC
  Filled 2023-12-05: qty 1

## 2023-12-05 MED ORDER — ROCURONIUM BROMIDE 10 MG/ML (PF) SYRINGE
PREFILLED_SYRINGE | INTRAVENOUS | Status: AC
  Filled 2023-12-05: qty 10

## 2023-12-05 SURGICAL SUPPLY — 12 items
BAR FIX PREFORMED OMNIMAX (Miscellaneous) IMPLANT
COVER SURGICAL LIGHT HANDLE (MISCELLANEOUS) IMPLANT
DRIVER SURG ZDRIVE HIGH TORQUE (ORTHOPEDIC DISPOSABLE SUPPLIES) IMPLANT
GOWN STRL REUS W/ TWL LRG LVL3 (GOWN DISPOSABLE) IMPLANT
KIT BASIN OR (CUSTOM PROCEDURE TRAY) IMPLANT
KIT TURNOVER KIT B (KITS) IMPLANT
SCREW BONE 2X7 CROSS DRIVE (Screw) IMPLANT
SCREW BONE MANDIB SD 2X9 (Screw) IMPLANT
SUT BONE WAX W31G (SUTURE) IMPLANT
TOWEL GREEN STERILE FF (TOWEL DISPOSABLE) IMPLANT
TRAY ENT MC OR (CUSTOM PROCEDURE TRAY) IMPLANT
WIRE 24 GAUGE OMINIMAX MMF (WIRE) IMPLANT
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
## 2023-12-05 NOTE — Care Plan (Signed)
 Plastic Surgery Plan of Care:  Pt is s/p closed treatment of bilateral mandible fractures with MMF.    Diet can be progressed to wire-jaw / blenderized diet when appropriate.    Plan will be 6 weeks of MMF with eventual transition from wires to guiding elastics. He may be discharged when alert, maintaining PO intake, pain controlled with PO medications.  - I instructed and discussed with the patient that he should perform regular intra-oral hygiene measures. Gentle swish-and-spit with and brushing teeth without toothpaste is OK. WaterPik may be used.  - Peridex mouthwash BID ordered for 7 days  - Dental wax PRN to arch bars for comfort - this can be sent up from the OR if supply is exhausted and can be continued at discharge  - Elevate HOB > 30 degrees to minimize facial swelling  - Wire cutters at bedside at all times - if emesis/depressed level of consciousness, significant other and patient were instructed to cut wires.    Plan of care discussed with family.

## 2023-12-05 NOTE — Anesthesia Postprocedure Evaluation (Signed)
 Anesthesia Post Note  Patient: [name]  Procedure(s) Performed: CLOSED REDUCTION, MANDIBLE, WITH ARCH BAR APPLICATION AND INTERMAXILLARY FIXATION; possible open reduction of bilateral mandible fractures with maxillomandibular fixation (Bilateral)     Patient location during evaluation: PACU Anesthesia Type: General Level of consciousness: sedated and patient cooperative Pain management: pain level controlled Vital Signs Assessment: post-procedure vital signs reviewed and stable Respiratory status: spontaneous breathing Cardiovascular status: stable Anesthetic complications: no   No notable events documented.  Last Vitals:  Vitals:   12/05/23 1315 12/05/23 1343  BP: 115/67 109/68  Pulse: 79 76  Resp: 20 18  Temp: 37.1 C 36.9 C  SpO2: 93% 94%    Last Pain:  Vitals:   12/05/23 1343  TempSrc: Oral  PainSc:                  Lewie Loron

## 2023-12-05 NOTE — Interval H&P Note (Signed)
 History and Physical Interval Note:  12/05/2023 10:00 AM  [name]  presents for for surgery, with the diagnosis of bilateral mandibular angle fractures.  The various methods of treatment have been discussed with the patient and and his loved one. After consideration of risks, benefits and other options for treatment, the patient has consented to  Procedure(s) with comments: CLOSED REDUCTION, MANDIBLE, WITH ARCH BAR APPLICATION AND INTERMAXILLARY FIXATION; possible open reduction of bilateral mandible fractures with maxillomandibular fixation (Bilateral) - Nasal RAE.  The patient's history has been reviewed. I reviewed pre-operative imaging and workup and requested additional workup including UDS and ethanol levels. Labs are notable for elevated serum creatinine and mild leukocytosis. Electrolytes wnl. UDS + for cocaine and THC. I re-examined the patient and he is medically appropriate for surgery. I will discuss UDS results with anesthesia prior to administration of general. I reviewed indications, risks, benefits, and alternative to surgical intervention and answered all questions to the patient's satisfaction.   Will proceed as planned   Kai Levins
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
## 2023-12-05 NOTE — Anesthesia Procedure Notes (Signed)
 Procedure Name: Intubation Date/Time: 12/05/2023 10:55 AM  Performed by: Gloris Ham, CRNAPre-anesthesia Checklist: Patient identified, Emergency Drugs available, Suction available and Patient being monitored Patient Re-evaluated:Patient Re-evaluated prior to induction Oxygen Delivery Method: Circle System Utilized Preoxygenation: Pre-oxygenation with 100% oxygen Induction Type: IV induction Ventilation: Mask ventilation without difficulty Laryngoscope Size: Glidescope and 3 Grade View: Grade II Nasal Tubes: Nasal Rae, Nasal prep performed and Right Tube size: 7.5 mm Number of attempts: 1 Placement Confirmation: ETT inserted through vocal cords under direct vision, positive ETCO2 and breath sounds checked- equal and bilateral Tube secured with: Tape Dental Injury: Teeth and Oropharynx as per pre-operative assessment  Comments: Nasal tube resting at bend, positioned by Surgeon and sutured in place.  Padded on forehead.  No trauma during intubation.  +[facility], +ETCO2 after turning the bed 90degrees.
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
## 2023-12-05 NOTE — Care Plan (Signed)
 [facility] Plan of Care:   I spoke with patient's floor nurse at approximately 14:52. After his family requested he stay overnight for monitoring and pain control, patient is insistent upon discharge this afternoon. Per nursing, he has stated that he will leave against medical advice if necessary and demanded that discharge orders be placed. I have spoken with the patient and family and provided education regarding post-operative care, oral hygiene measures, importance of compliance with MMF, safety measures involving MMF wires and indications for removal; abstinence from nicotine and psychoactive drugs/alcohol to promote healing and prevent potentially life threatening aspiration or other complication. The patient has demonstrated that he can meet basic requirements for self care at home. He is ambulating, voiding, taking PO and otherwise appropriate for discharge from a surgical and medical standpoint. I will place discharge orders to facilitate him having access to post-op medications as I have prescribed and arrange close follow up in 1-2 weeks.

## 2023-12-05 NOTE — Anesthesia Preprocedure Evaluation (Addendum)
 Anesthesia Evaluation  Patient identified by MRN, date of birth, ID band Patient awake    Reviewed: Allergy & Precautions, NPO status , Patient's Chart, lab work & pertinent test results  Airway Mallampati: II  TM Distance: >3 FB Neck ROM: Limited    Dental no notable dental hx. 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  Endo/Other  negative endocrine ROS    Renal/GU negative Renal ROS     Musculoskeletal negative musculoskeletal ROS (+)    Abdominal   Peds  Hematology negative hematology ROS (+)   Anesthesia Other Findings   Reproductive/Obstetrics                             Anesthesia Physical Anesthesia Plan  ASA: 2  Anesthesia Plan: General   Post-op Pain Management: Ofirmev IV (intra-op)* and Toradol IV (intra-op)*   Induction: Intravenous  PONV Risk Score and Plan: 3 and Ondansetron, Dexamethasone, Treatment may vary due to age or medical condition and Midazolam  Airway Management Planned: Video Laryngoscope Planned and Nasal ETT  Additional Equipment:   Intra-op Plan:   Post-operative Plan: Extubation in OR  Informed Consent: I have reviewed the patients History and Physical, chart, labs and discussed the procedure including the risks, benefits and alternatives for the proposed anesthesia with the patient or authorized representative who has indicated his/her understanding and acceptance.     Dental advisory given  Plan Discussed with: CRNA  Anesthesia Plan Comments:          Anesthesia Quick Evaluation

## 2023-12-05 NOTE — Discharge Instructions (Addendum)
 Surgery Discharge Instructions:  1. Call your doctor or go to the emergency room if you have:  - Fever of 101 degrees or higher - Uncontrollable pain or pain that has increased greatly since your surgery - Increased redness around your incision (cut); incision pulling apart; thick, white drainage (pus), bleeding, or a large amount of fluid from your incision. - Nausea and vomiting that continue despite medications - Urine output less than is normal for you  - Chest pain/shortness of breath  2. Wound Care/Dressings/Drain Instructions:   - It is important to keep your mouth and teeth clean. Mouthwash has been sent to your pharmacy and should be used twice per day. You may swish-and-spit water and mouthwash and brush your teeth without toothpaste. Some patients use a WaterPik to keep the gums clean - this is optional and can be bought over the counter.  - Use dental wax (available over the counter at any drug store) anywhere in the mouth where you feel discomfort or 'sharpness' from the arch bars and/or wires. This will help protect the inside of your mouth and decrease your pain.  - Keep your head elevated on multiple pillows or sleep in a recliner to decrease the amount of swelling in your face - Take tylenol and motrin on a schedule. These have been sent to your pharmacy. It is best to take these even if you are not in severe pain, since they 'work together.' Take them as follows for best effect:  tylenol (up to 1000 mg every 6 hours) and ibuprofen (600 mg every 6 hours). Alternate these medications every 3 hours (tylenol at 12 and 6 o'clock, ibuprofen at 3 and 9 o'clock) for maximum relief.  - Oxycodone is a stronger pain medicine and should be taken if the above medicines are not working. You cannot drive or operate machinery on this medicine. It is addictive and can be dangerous if too much is taken. Use only as directed.  - Anti-nausea and stool softener has been sent to your pharmacy - Wire  cutters will be sent home with you - use these in an emergency to cut the wires holding the jaw shut; for example, if there are episodes of vomiting and/or choking that could cause aspiration or suffocation.  - Otherwise, leave the wires in place. These will remain in place for 6 weeks until the jaw is healed.    3. Follow Up:  - A follow up appointment should be scheduled for you one to two weeks after discharge, and our secretaries will be contacting you with the details of that appointment. However, If you do not hear about your appoinment in 3 business days, please call the provided surgery clinic number and confirm/schedule your appointment.     4. Activity/Restrictions:  - Resume your regular activities, as tolerated  5. Diet: After surgery, you will need to eat foods that can be blended so that they can be sipped from a straw or given by mouth through a syringe. Work with an Financial controller in diet and nutrition (dietitian) to create an eating plan that helps you get the nutrients you need to heal and stay healthy. What are tips for following this plan? Cooking Before blending, remove any skins, seeds, or peels from food. Cook meats and vegetables until tender. Cut foods into small pieces. Mix them with a small amount of liquid in a food processor or blender. Keep adding liquid until the food is thin enough to sip through a straw. Add liquids such  as juice, milk, cream, broth, gravy, or vegetable juice to help add flavor to foods. Heat foods after they have been blended, not before. This reduces the amount of foam created from blending. If you need more calories: Add protein powder or powdered milk to foods. Cook with fats. These include sour cream, cream cheese, cream, nut butters, and margarine without trans fat. Prepare foods with sweeteners, such as honey, ice cream, blackstrap molasses, or sugar. Meal planning Eat at least three meals and three snacks a day. Make sure that you get enough  calories and protein to prevent weight loss and help your body heal. Eat a mix of foods from each food group daily. The food groups include fruits and vegetables, protein, whole grains, dairy, and healthy fats. If your teeth and mouth are sensitive to extreme temperatures, heat or cool your foods until they are lukewarm. General information All foods in this plan must be blended. Avoid nuts, seeds, skins, peels, bones, or any foods that cannot be blended to the right consistency. Ask your health care provider if you should take a liquid multivitamin to make sure that you get all the vitamins and minerals you need. Liquid supplements can help you get the nutrition you need. Talk to your provider about this option. What foods should I eat? The items listed below may not be all the foods and drinks you can have. Talk to your dietitian to learn more. Grains Hot cereals, such as oatmeal, grits, ground wheat cereals, and polenta. Rice and pasta. Couscous. Quinoa. Vegetables All cooked or canned vegetables, without seeds and skins. Vegetable juices. Cooked potatoes, without skins. Fruits Any cooked or canned fruits, without seeds and skins. Fresh, peeled soft fruits, such as bananas and peaches, that can be blended until smooth. All fruit juices, without seeds and skins. Meat and other protein foods Soft-boiled eggs, scrambled eggs, powdered eggs, pasteurized egg mixtures, and custard. Ground meats, such as hamburger, [place], sausage, and meatloaf. Tender, well-cooked meat, poultry, and fish, prepared without bones or skin. Soft soy foods, such as tofu. Smooth nut butters. Liquid egg substitutes. Dairy Milk. Cheese. Yogurt. Cottage cheese. Pudding. Beverages Coffee (regular or decaffeinated), tea, and mineral water. Liquid supplements that have protein and calories. Fats and oils Any oils. Melted margarine or butter. Ghee. Sour cream. Cream cheese. Avocado. Seasoning and other foods All seasonings  and condiments that blend well. Ground spices. Finely ground seeds and nuts. Mustard or any smooth condiment. This information is not intended to replace advice given to you by your health care provider. Make sure you discuss any questions you have with your health care provider.  6. Additional Instructions: - Please take an over the counter stool softener while taking narcotic pain medication - DO NOT MIX NARCOTIC PAIN MEDICATIONS OR TAKE NARCOTIC PRESCRIPTIONS AT THE SAME TIME (PERCODET, LORTAB, ROXICODONE, ETC.) - DO NOT DRIVE OR OPERATE HEAVY MACHINERY WHILE ON NARCOTICS  - DO NOT TAKE MORE THAN 4 GRAMS (4000mg ) OF TYLENOL (ACETAMINOPHEN) IN 24 HOURS ________________________________________________________________ Department of Plastic Surgery Contact Info: Plastic Surgery Office Contact for daytime hours - [phone] Office/ physician access line after hours [phone]

## 2023-12-05 NOTE — Transfer of Care (Signed)
 Immediate Anesthesia Transfer of Care Note  Patient: Denzal ALFONZIA WOOLUM  Procedure(s) Performed: CLOSED REDUCTION, MANDIBLE, WITH ARCH BAR APPLICATION AND INTERMAXILLARY FIXATION; possible open reduction of bilateral mandible fractures with maxillomandibular fixation (Bilateral)  Patient Location: PACU  Anesthesia Type:General  Level of Consciousness: drowsy and patient cooperative  Airway & Oxygen Therapy: Patient Spontanous Breathing and Patient connected to face mask  Post-op Assessment: Report given to RN and Post -op Vital signs reviewed and stable  Post vital signs: Reviewed and stable  Last Vitals:  Vitals Value Taken Time  BP 95/55 12/05/23 1215  Temp 37.1 C 12/05/23 1210  Pulse 93 12/05/23 1222  Resp 20 12/05/23 1222  SpO2 98 % 12/05/23 1222  Vitals shown include unfiled device data.  Last Pain:  Vitals:   12/05/23 1210  PainSc: Asleep         Complications: No notable events documented.
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
## 2023-12-05 NOTE — Progress Notes (Signed)
 Pt is adamant about leaving today. York Spaniel he will not be staying and plans to smoke once he hits the door. Education provided to the patient regarding risk of smoking in his current condition. Pt waved nurse away and was not receptive to the education.    He did take the oral tylenol and demonstrated that he can use the mouth rinse. He is drinking well through a straw and has tolerated an ensure and water. He has ambulated to the restroom and voided one time since arriving to the floor. MD notified of the aforementioned events.

## 2023-12-07 ENCOUNTER — Encounter (HOSPITAL_COMMUNITY): Payer: Self-pay

## 2023-12-08 ENCOUNTER — Emergency Department (HOSPITAL_COMMUNITY)
Admission: EM | Admit: 2023-12-08 | Discharge: 2023-12-08 | Discharge status: Against Medical Advice | Attending: Emergency Medicine | Admitting: Emergency Medicine

## 2023-12-08 ENCOUNTER — Encounter (HOSPITAL_COMMUNITY): Payer: Self-pay

## 2023-12-08 ENCOUNTER — Other Ambulatory Visit: Payer: Self-pay

## 2023-12-08 DIAGNOSIS — R6884 Jaw pain: Secondary | ICD-10-CM | POA: Insufficient documentation

## 2023-12-08 DIAGNOSIS — Z5321 Procedure and treatment not carried out due to patient leaving prior to being seen by health care provider: Secondary | ICD-10-CM | POA: Insufficient documentation

## 2023-12-08 NOTE — ED Notes (Signed)
 Pt stated he came for post-op supplies but the wait is too long. Pt stated he will come back another day. Pt seen leaving the ED.

## 2023-12-08 NOTE — ED Triage Notes (Addendum)
 Pt here for a follow up.Pt had jaw wired shut Saturday. Pt states surgeon told him to follow up with plastic surgery at the hospital. Pt has no other complaints at this time.
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
## 2023-12-13 ENCOUNTER — Encounter (HOSPITAL_COMMUNITY): Payer: Self-pay

## 2023-12-13 ENCOUNTER — Emergency Department (HOSPITAL_COMMUNITY)

## 2023-12-13 ENCOUNTER — Emergency Department (HOSPITAL_COMMUNITY): Admission: EM | Admit: 2023-12-13 | Discharge: 2023-12-13 | Disposition: A | Discharge status: Home / Self Care

## 2023-12-13 ENCOUNTER — Other Ambulatory Visit: Payer: Self-pay

## 2023-12-13 DIAGNOSIS — S02652D Fracture of angle of left mandible, subsequent encounter for fracture with routine healing: Secondary | ICD-10-CM | POA: Insufficient documentation

## 2023-12-13 DIAGNOSIS — S02651D Fracture of angle of right mandible, subsequent encounter for fracture with routine healing: Secondary | ICD-10-CM | POA: Insufficient documentation

## 2023-12-13 DIAGNOSIS — X58XXXD Exposure to other specified factors, subsequent encounter: Secondary | ICD-10-CM | POA: Diagnosis not present

## 2023-12-13 DIAGNOSIS — K0889 Other specified disorders of teeth and supporting structures: Secondary | ICD-10-CM | POA: Diagnosis present

## 2023-12-13 DIAGNOSIS — S02609D Fracture of mandible, unspecified, subsequent encounter for fracture with routine healing: Secondary | ICD-10-CM

## 2023-12-13 LAB — CBC
HCT: 38.7 % — ABNORMAL LOW (ref 39.0–52.0)
Hemoglobin: 12.1 g/dL — ABNORMAL LOW (ref 13.0–17.0)
MCH: 24.2 pg — ABNORMAL LOW (ref 26.0–34.0)
MCHC: 31.3 g/dL (ref 30.0–36.0)
MCV: 77.6 fL — ABNORMAL LOW (ref 80.0–100.0)
Platelets: 273 10*3/uL (ref 150–400)
RBC: 4.99 MIL/uL (ref 4.22–5.81)
RDW: 15.5 % (ref 11.5–15.5)
WBC: 8.2 10*3/uL (ref 4.0–10.5)
nRBC: 0 % (ref 0.0–0.2)

## 2023-12-13 LAB — BASIC METABOLIC PANEL WITH GFR
Anion gap: 8 (ref 5–15)
BUN: 6 mg/dL (ref 6–20)
CO2: 24 mmol/L (ref 22–32)
Calcium: 8.8 mg/dL — ABNORMAL LOW (ref 8.9–10.3)
Chloride: 105 mmol/L (ref 98–111)
Creatinine, Ser: 1.27 mg/dL — ABNORMAL HIGH (ref 0.61–1.24)
GFR, Estimated: >60 mL/min (ref >60)
Glucose, Bld: 112 mg/dL — ABNORMAL HIGH (ref 70–99)
Potassium: 3.9 mmol/L (ref 3.5–5.1)
Sodium: 137 mmol/L (ref 135–145)

## 2023-12-13 MED ORDER — FENTANYL CITRATE PF 50 MCG/ML IJ SOSY
50.0000 ug | PREFILLED_SYRINGE | Freq: Once | INTRAMUSCULAR | Status: AC
  Administered 2023-12-13: 50 ug via INTRAVENOUS
  Filled 2023-12-13: qty 1

## 2023-12-13 MED ORDER — LACTATED RINGERS IV BOLUS
1000.0000 mL | Freq: Once | INTRAVENOUS | Status: AC
  Administered 2023-12-13: 1000 mL via INTRAVENOUS

## 2023-12-13 MED ORDER — ONDANSETRON HCL 4 MG/2ML IJ SOLN
4.0000 mg | Freq: Once | INTRAMUSCULAR | Status: AC
  Administered 2023-12-13: 4 mg via INTRAVENOUS
  Filled 2023-12-13: qty 2

## 2023-12-13 MED ORDER — SODIUM CHLORIDE 0.9 % IV SOLN
3.0000 g | Freq: Once | INTRAVENOUS | Status: AC
  Administered 2023-12-13: 3 g via INTRAVENOUS
  Filled 2023-12-13: qty 8

## 2023-12-13 NOTE — Discharge Instructions (Signed)
 Please pick up some orthodontic wax from the drugstore and you may place that on the wires to see if this helps with your lip irritation.  Your CT scan shows that everything is healing well.  Please continue the Tylenol and ibuprofen for pain.  Try supplementing with the Ensure shakes.  Please dial [phone] to schedule a follow-up appointment with Dr. Marthenia Rolling.  His office has been trying to get in touch with you for a follow-up appointment.  Try sleeping in a recliner over the next night or 2 to see if this helps with the swelling as well.  Return to the emergency department for worsening symptoms.
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
## 2023-12-13 NOTE — ED Triage Notes (Signed)
 Pt has his mouth wired shut last Saturday and now it is hurting states him, feels like the wires are cutting him.

## 2024-01-13 NOTE — Progress Notes (Addendum)
 Plastic and Reconstructive Surgery Follow-up Visit   Surgery: 12/05/23   PROCEDURE: Application of hybrid arch bars/maxillomandibular fixation for treatment of bilateral mandibular angle fractures  Interval history: Patient seen today for follow-up at his request.  He is now roughly 5 weeks out from placement of hybrid arch bars/closed treatment of bilateral mandible fractures.  He tells me he has a court date next week and may be incarcerated at time of upcoming procedure for arch bar removal.  He is currently scheduled for removal on 01/19/24, but is concerned that he will be unavailable due to his legal situation.  He has otherwise been well at home.  Maintaining p.o. intake with protein shakes and blenderized diet.  He endorses normal bite subjectively.  He has been using dental wax and maintaining oral hygiene.  He complains of some pain associated with the arch bars digging into his gums but otherwise has no complaints.  Pain has been manageable with occasional ibuprofen  and Tylenol  at home.  Bilateral fracture sites are no longer tender.  He inquires today whether he can just have his arch bars removed while he is in clinic.   Objective: Vitals:   01/13/24 0940  BP: 126/72  Pulse: 64  Temp: 97.5 F (36.4 C)    General: Alert, oriented x 3, not in acute distress HEENT: Arch bars remain in place and secure. Occlusion is class I with good intercuspation which feels subjectively 'normal' to him.  Mandible is nontender in all locations.  Mucosa adjacent to arch bars is swollen and irritated where archbars are in contact. No obvious loose or unstable teeth.   Wires were removed - mandible is stable to bimanual compression.  Interincisal opening is 3.5 cm.  Assessment/plan: Kemon Abed Schar is a 28 y.o. male now 5 weeks status post treatment of bilateral mandibular angle fractures with maxillomandibular fixation.   At this point, his mandible appears to be healed clinically. He is  non-tender at fracture sites and mandible is stable to compression when wires are removed.   Given his current social situation I offered to remove arch bars in clinic today.  I do not feel Panorex or imaging is necessary based on clinical exam which suggests healing.   I reviewed risks and benefits of removal.  I originally intended 6 weeks of MMF; shortening this period to 5 weeks does in theory increased risk of fracture nonunion and/or mandibular instability.  I discussed specific risks including pain, scarring, bleeding, loss or damage to teeth, need for future dental or orthodontic treatments, malocclusion, TMJ dysfunction, ankylosis, arthritis.  He accepts these risks and agreed to proceed with removal of arch bars in clinic today.  He tolerated this well; see procedural note below.  I discussed mandibular range of motion exercises which she will perform at home.  He will continue Peridex  mouthwash once daily for 2 weeks.  I recommend he see a dentist for dental cleaning within the next month.  I advised him to continue a soft diet for another week and to avoid salty, acidic foods while intraoral wounds heal.  We discussed return precautions and he will contact me with any signs of mandibular instability, pain, or other concerns.  I request that he follow-up with me in 1 to 2 months or sooner should any issues arise.     Procedure : Preoperative diagnosis: Bilateral mandibular angle fractures Postoperative diagnosis: Same  Procedure: Removal of hybrid arch bars  Surgeon: Lang Shadow, MD  Indications: This is a 28 year old male  with a history of mandibular trauma treated with maxillomandibular fixation, now with signs of bony healing.  Findings: Bimanual exam performed of the mandible demonstrates stability.  No point tenderness at fracture sites.  Class I occlusion is maintained.  Procedure in detail: The procedure was performed semisterilely in the clinic.  Mouth was prepped with  Peridex  mouthwash.  Sites of gingival overgrowth were injected with 2% lidocaine  with epinephrine  for analgesic effect.  Beginning with the maxilla, a total of 6 screws were removed with universal screwdriver from the upper arch bar construct.  The arch bar was then easily retrieved and removed from the oral cavity.  Mandibular construct was removed next.  A total of 5 screws were removed with universal screwdriver and the mandibular arch bar was mobilized and removed.  The oral cavity was clear of all foreign body and material and the oral cavity rinsed.  He tolerated the procedure well. Anesthesia: 2 cc 2% lidocaine  with epinephrine .  Specimens: none  Complications: none  Duration: 24 minutes

## 2024-09-25 ENCOUNTER — Emergency Department (HOSPITAL_COMMUNITY)
Admission: EM | Admit: 2024-09-25 | Discharge: 2024-09-25 | Disposition: A | Discharge status: Home / Self Care | Attending: Emergency Medicine | Admitting: Emergency Medicine

## 2024-09-25 ENCOUNTER — Other Ambulatory Visit: Payer: Self-pay

## 2024-09-25 ENCOUNTER — Encounter (HOSPITAL_COMMUNITY): Payer: Self-pay

## 2024-09-25 DIAGNOSIS — J101 Influenza due to other identified influenza virus with other respiratory manifestations: Secondary | ICD-10-CM | POA: Insufficient documentation

## 2024-09-25 DIAGNOSIS — J069 Acute upper respiratory infection, unspecified: Secondary | ICD-10-CM

## 2024-09-25 DIAGNOSIS — R059 Cough, unspecified: Secondary | ICD-10-CM | POA: Diagnosis present

## 2024-09-25 LAB — RESP PANEL BY RT-PCR (RSV, FLU A&B, COVID)  RVPGX2
Influenza A by PCR: NEGATIVE
Influenza B by PCR: POSITIVE — AB
Resp Syncytial Virus by PCR: NEGATIVE
SARS Coronavirus 2 by RT PCR: NEGATIVE

## 2024-09-25 MED ORDER — DEXAMETHASONE 4 MG PO TABS
10.0000 mg | ORAL_TABLET | Freq: Once | ORAL | Status: AC
  Administered 2024-09-25: 10 mg via ORAL
  Filled 2024-09-25: qty 3

## 2024-09-25 MED ORDER — IBUPROFEN 600 MG PO TABS: 600.0000 mg | ORAL_TABLET | Freq: Four times a day (QID) | ORAL | 0 refills | Status: AC | PRN

## 2024-09-25 MED ORDER — KETOROLAC TROMETHAMINE 15 MG/ML IJ SOLN
15.0000 mg | Freq: Once | INTRAMUSCULAR | Status: AC
  Administered 2024-09-25: 15 mg via INTRAMUSCULAR
  Filled 2024-09-25: qty 1

## 2024-09-25 MED ORDER — BENZONATATE 100 MG PO CAPS: 100.0000 mg | ORAL_CAPSULE | Freq: Three times a day (TID) | ORAL | 0 refills | Status: AC | PRN

## 2024-09-25 NOTE — ED Provider Notes (Signed)
 " [place] Provider Note  CSN: [id number] Arrival date & time: 09/25/24 0240  Chief Complaint(s) Generalized Body Aches and Cough  HPI [name] is a 29 y.o. male presenting with URI symptoms. He reports body aches, fevers, congestion, runny nose, fatigue, malaise. Has been present for 3 days. Family also sick with similar symptoms. No shortness of breath, chest pain, abdominal pain, nausea or vomiting   Past Medical History Past Medical History:  Diagnosis Date   Anxiety    Depression    Patient Active Problem List   Diagnosis Date Noted   Mandible fracture (HCC) 12/05/2023   Self-inflicted injury    Bipolar 1 disorder (HCC) 09/02/2019   Home Medication(s) Prior to Admission medications  Medication Sig Start Date End Date Taking? Authorizing Provider  benzonatate  (TESSALON ) 100 MG capsule Take 1 capsule (100 mg total) by mouth 3 (three) times daily as needed for cough. 09/25/24  Yes Francesca Elsie CROME, MD  ibuprofen  (ADVIL ) 600 MG tablet Take 1 tablet (600 mg total) by mouth every 6 (six) hours as needed. 09/25/24  Yes Francesca Elsie CROME, MD  ondansetron  (ZOFRAN -ODT) 4 MG disintegrating tablet Take 1 tablet (4 mg total) by mouth every 8 (eight) hours as needed for nausea or vomiting. 12/05/23   Elfrieda Cadet, MD                                                                                                                                    Past Surgical History Past Surgical History:  Procedure Laterality Date   CLOSED REDUCTION MANDIBLE WITH MANDIBULOMA Bilateral 12/05/2023   Procedure: CLOSED REDUCTION, MANDIBLE, WITH ARCH BAR APPLICATION AND INTERMAXILLARY FIXATION; possible open reduction of bilateral mandible fractures with maxillomandibular fixation;  Surgeon: Elfrieda Cadet, MD;  Location: [facility] OR;  Service: Plastics;  Laterality: Bilateral;  Nasal RAE   Family History History reviewed. No pertinent family history.  Social  History Social History[1] Allergies Zyprexa [olanzapine]  Review of Systems Review of Systems  All other systems reviewed and are negative.   Physical Exam Vital Signs  I have reviewed the triage vital signs BP 126/80 (BP Location: Left Arm)   Pulse 72   Temp 98.2 F (36.8 C) (Oral)   Resp 16   Ht 6' 4 (1.93 m)   Wt 97.5 kg   SpO2 97%   BMI 26.16 kg/m  Physical Exam Vitals and nursing note reviewed.  Constitutional:      General: He is not in acute distress.    Appearance: Normal appearance.  HENT:     Mouth/Throat:     Mouth: Mucous membranes are moist.  Eyes:     Conjunctiva/sclera: Conjunctivae normal.  Cardiovascular:     Rate and Rhythm: Normal rate and regular rhythm.  Pulmonary:     Effort: Pulmonary effort is normal. No respiratory distress.     Breath sounds: Normal breath sounds.  Abdominal:     General: Abdomen is flat.     Palpations: Abdomen is soft.     Tenderness: There is no abdominal tenderness.  Musculoskeletal:     Right lower leg: No edema.     Left lower leg: No edema.  Skin:    General: Skin is warm and dry.     Capillary Refill: Capillary refill takes less than 2 seconds.  Neurological:     Mental Status: He is alert and oriented to person, place, and time. Mental status is at baseline.  Psychiatric:        Mood and Affect: Mood normal.        Behavior: Behavior normal.     ED Results and Treatments Labs (all labs ordered are listed, but only abnormal results are displayed) Labs Reviewed  RESP PANEL BY RT-PCR (RSV, FLU A&B, COVID)  RVPGX2 - Abnormal; Notable for the following components:      Result Value   Influenza B by PCR POSITIVE (*)    All other components within normal limits                                                                                                                          Radiology No results found.  Pertinent labs & imaging results that were available during my care of the patient were reviewed  by me and considered in my medical decision making (see MDM for details).  Medications Ordered in ED Medications  ketorolac  (TORADOL ) 15 MG/ML injection 15 mg (15 mg Intramuscular Given 09/25/24 0434)  dexamethasone  (DECADRON ) tablet 10 mg (10 mg Oral Given 09/25/24 0433)                                                                                                                                     Procedures Procedures  (including critical care time)  Medical Decision Making / ED Course   MDM:  29 y/o presenting with URI symptoms.   Symptoms seem consistent with viral infection. Examination reassuring including clear lungs, reassuring vitals. Doubt other process such as pneumonia. Flu test is positive. Discussed self care with patient. Will discharge patient to home. All questions answered. Patient comfortable with plan of discharge. Return precautions discussed with patient and specified on the after visit summary.       Lab Tests: -I ordered, reviewed, and interpreted labs.   The pertinent  results include:   Labs Reviewed  RESP PANEL BY RT-PCR (RSV, FLU A&B, COVID)  RVPGX2 - Abnormal; Notable for the following components:      Result Value   Influenza B by PCR POSITIVE (*)    All other components within normal limits    Notable for +flu  EKG   EKG Interpretation Date/Time:    Ventricular Rate:    PR Interval:    QRS Duration:    QT Interval:    QTC Calculation:   R Axis:      Text Interpretation:          Medicines ordered and prescription drug management: Meds ordered this encounter  Medications   ketorolac  (TORADOL ) 15 MG/ML injection 15 mg   dexamethasone  (DECADRON ) tablet 10 mg   benzonatate  (TESSALON ) 100 MG capsule    Sig: Take 1 capsule (100 mg total) by mouth 3 (three) times daily as needed for cough.    Dispense:  21 capsule    Refill:  0   ibuprofen  (ADVIL ) 600 MG tablet    Sig: Take 1 tablet (600 mg total) by mouth every 6 (six) hours as  needed.    Dispense:  30 tablet    Refill:  0    -I have reviewed the patients home medicines and have made adjustments as needed  Reevaluation: After the interventions noted above, I reevaluated the patient and found that their symptoms have improved  Co morbidities that complicate the patient evaluation  Past Medical History:  Diagnosis Date   Anxiety    Depression       Dispostion: Disposition decision including need for hospitalization was considered, and patient discharged from emergency department.    Final Clinical Impression(s) / ED Diagnoses Final diagnoses:  Viral URI     This chart was dictated using voice recognition software.  Despite best efforts to proofread,  errors can occur which can change the documentation meaning.     [1]  Social History Tobacco Use   Smoking status: Every Day    Current packs/day: 1.00    Average packs/day: 1 pack/day for 10.0 years (10.0 ttl pk-yrs)    Types: Cigarettes   Smokeless tobacco: Never  Vaping Use   Vaping status: Never Used  Substance Use Topics   Alcohol use: Yes    Comment: sometimes   Drug use: Yes    Types: Marijuana    Comment: daily     Francesca Elsie CROME, MD 09/26/24 1351  "

## 2024-09-25 NOTE — ED Notes (Signed)
 Pt in peds ED triage with child. Pt asked RN if nose swabs are the only way to check for sickness. RN educated pt on how nasal swabs check for respiratory viruses, what respiratory viruses are and symptoms of respiratory viruses. Pt asked follow up questions regarding respiratory viruses, all questions answered by RN. Pt verbalized understanding.

## 2024-09-25 NOTE — ED Notes (Signed)
 Pt refusing nasal swab at this time.This clinical research associate educated the pt that he needed the swab to see if there is a viral illness causing his symptoms. States that if I put the swab in his nose that it will  make my fucking headache worse, dont put that shit in my nose. This clinical research associate informed the pt that he had the right to refuse. Pt continued to yell and cuss at this clinical research associate. Sent back to the lobby to wait for a room in the back.

## 2024-09-25 NOTE — ED Triage Notes (Signed)
 Pt reports generalized body aches, fever, cough x2 days.

## 2024-09-25 NOTE — Discharge Instructions (Addendum)
 Please take Tylenol  (acetaminophen ) and Motrin  (ibuprofen ) for your symptoms at home.  You can take 1000 mg of Tylenol  every 6 hours and 600 mg of Motrin  every 6 hours as needed for your symptoms.  You can take these medicines together as needed, either at the same time, or alternating every 3 hours. We have also prescribed you a medicine for cough. Please try drinking hot tea with honey and lemon for your sore throat.   Please make sure you are drinking lots of fluids and getting rest.
